# Patient Record
Sex: Female | Born: 1950 | Race: White | Hispanic: No | Marital: Married | Smoking: Never smoker
Health system: Southern US, Community
[De-identification: ages and names within clinical notes are randomized; demographics above are authoritative.]

## PROBLEM LIST (undated history)

## (undated) DIAGNOSIS — C801 Malignant (primary) neoplasm, unspecified: Secondary | ICD-10-CM

## (undated) DIAGNOSIS — G2581 Restless legs syndrome: Secondary | ICD-10-CM

## (undated) HISTORY — PX: COLONOSCOPY: SHX174

## (undated) HISTORY — DX: Restless legs syndrome: G25.81

## (undated) HISTORY — PX: CHOLECYSTECTOMY: SHX55

## (undated) HISTORY — DX: Malignant (primary) neoplasm, unspecified: C80.1

---
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
## 1977-03-18 HISTORY — PX: ABDOMINAL HYSTERECTOMY: SHX81

## 2004-04-30 ENCOUNTER — Ambulatory Visit: Payer: Self-pay | Admitting: Unknown Physician Specialty

## 2005-07-04 ENCOUNTER — Ambulatory Visit: Payer: Self-pay | Admitting: Unknown Physician Specialty

## 2005-08-13 ENCOUNTER — Ambulatory Visit: Payer: Self-pay | Admitting: Gastroenterology

## 2006-08-21 ENCOUNTER — Ambulatory Visit: Payer: Self-pay | Admitting: Unknown Physician Specialty

## 2006-08-28 ENCOUNTER — Ambulatory Visit: Payer: Self-pay | Admitting: Unknown Physician Specialty

## 2006-10-02 ENCOUNTER — Ambulatory Visit: Payer: Self-pay | Admitting: Specialist

## 2007-09-02 ENCOUNTER — Ambulatory Visit: Payer: Self-pay | Admitting: Unknown Physician Specialty

## 2009-02-13 ENCOUNTER — Ambulatory Visit: Payer: Self-pay | Admitting: Unknown Physician Specialty

## 2009-12-06 ENCOUNTER — Ambulatory Visit: Payer: Self-pay | Admitting: Otolaryngology

## 2010-03-05 ENCOUNTER — Ambulatory Visit: Payer: Self-pay | Admitting: Unknown Physician Specialty

## 2011-03-18 ENCOUNTER — Ambulatory Visit: Payer: Self-pay | Admitting: Unknown Physician Specialty

## 2011-10-11 ENCOUNTER — Ambulatory Visit (INDEPENDENT_AMBULATORY_CARE_PROVIDER_SITE_OTHER): Payer: BC Managed Care – PPO | Admitting: Internal Medicine

## 2011-10-11 ENCOUNTER — Encounter: Payer: Self-pay | Admitting: Internal Medicine

## 2011-10-11 VITALS — BP 110/68 | HR 77 | Temp 98.4°F | Resp 14 | Ht 65.5 in | Wt 158.5 lb

## 2011-10-11 DIAGNOSIS — Z13220 Encounter for screening for lipoid disorders: Secondary | ICD-10-CM

## 2011-10-11 DIAGNOSIS — Z124 Encounter for screening for malignant neoplasm of cervix: Secondary | ICD-10-CM

## 2011-10-11 DIAGNOSIS — G2581 Restless legs syndrome: Secondary | ICD-10-CM

## 2011-10-11 DIAGNOSIS — Z23 Encounter for immunization: Secondary | ICD-10-CM

## 2011-10-11 DIAGNOSIS — C801 Malignant (primary) neoplasm, unspecified: Secondary | ICD-10-CM

## 2011-10-11 DIAGNOSIS — C44310 Basal cell carcinoma of skin of unspecified parts of face: Secondary | ICD-10-CM | POA: Insufficient documentation

## 2011-10-11 NOTE — Progress Notes (Signed)
Patient ID: Christina Hammond, female   DOB: [birth year], 61 y.o.   MRN: [id number]   Patient Active Problem List  Diagnosis  . Cancer  . Restless legs syndrome (RLS)    Subjective:  CC:   Chief Complaint  Patient presents with  . New Patient    HPI:   Christina Hammond a 61 y.o. female who presents  In order to establish primary care .  She is a healthy 61 yr old white female with no significant PMH other than mild restless legs syndrome that she manages without medication. She is acute occasional seasonal rhinitis which she manages with over-the-counter antihistamines. She is transferring care from [facility].  Past Medical History  Diagnosis Date  . Cancer     basal cell,  [facility]   . Restless legs syndrome (RLS)     managed with minimal meds    Past Surgical History  Procedure Date  . Abdominal hysterectomy 1979  . Cholecystectomy   . Appendectomy 1956  . Cesarean section          The following portions of the patient's history were reviewed and updated as appropriate: Allergies, current medications, and problem list.    Review of Systems:   Review of Systems  Constitutional: Negative for fever, weight loss and malaise/fatigue.  HENT: Negative for nosebleeds and sore throat.   Eyes: Negative for blurred vision.  Respiratory: Negative for cough and shortness of breath.   Cardiovascular: Negative for chest pain, orthopnea and PND.  Gastrointestinal: Negative for heartburn, nausea, abdominal pain and blood in stool.  Genitourinary: Negative for frequency.  Musculoskeletal: Negative for back pain and falls.  Skin: Negative for rash.  Neurological: Negative for dizziness and headaches.  Endo/Heme/Allergies: Does not bruise/bleed easily.  Psychiatric/Behavioral: Negative for depression and memory loss. The patient is not nervous/anxious.        History   Social History  . Marital Status: Married    Spouse Name: N/A    Number of Children: N/A  . Years  of Education: N/A   Occupational History  . real estate agent    Social History Main Topics  . Smoking status: Never Smoker   . Smokeless tobacco: Never Used  . Alcohol Use: Yes  . Drug Use: No  . Sexually Active: Not on file   Other Topics Concern  . Not on file   Social History Narrative  . No narrative on file    Objective:  BP 110/68  Pulse 77  Temp 98.4 F (36.9 C) (Oral)  Resp 14  Ht 5' 5.5" (1.664 m)  Wt 158 lb 8 oz (71.895 kg)  BMI 25.97 kg/m2  SpO2 98%  General appearance: alert, cooperative and appears stated age Ears: normal TM's and external ear canals both ears Throat: lips, mucosa, and tongue normal; teeth and gums normal Neck: no adenopathy, no carotid bruit, supple, symmetrical, trachea midline and thyroid not enlarged, symmetric, no tenderness/mass/nodules Back: symmetric, no curvature. ROM normal. No CVA tenderness. Lungs: clear to auscultation bilaterally Heart: regular rate and rhythm, S1, S2 normal, no murmur, click, rub or gallop Abdomen: soft, non-tender; bowel sounds normal; no masses,  no organomegaly Pulses: 2+ and symmetric Skin: Skin color, texture, turgor normal. No rashes or lesions Lymph nodes: Cervical, supraclavicular, and axillary nodes normal.  Assessment and Plan:  Restless legs syndrome (RLS) Manage with relaxation and Epsom salts soaks. No medication is needed.  Cancer She had a basal cell carcinoma removed by a dermatologist at [facility].  Reviewed  proper use of sunscreen hours to avoid sun and use of vitamin D.  Screening for malignant neoplasm of the cervix Her last Pap smear was in 2012. She'll return for her annual exam with pelvic in 3 months.  Screening for lipoid disorders Per patient she has no history of hyperlipidemia we reviewed her diet today and she is following measuring diet on most days.   Updated Medication List Outpatient Encounter Prescriptions as of 10/11/2011  Medication Sig Dispense Refill  . calcium  carbonate (OS-CAL) 600 MG TABS Take 600 mg by mouth 2 (two) times daily with a meal.

## 2011-10-11 NOTE — Patient Instructions (Addendum)
Confirm the allergy to diclofenac..     Consider a Low Glycemic Index Diet and eating 6 smaller meals daily .  This frequent feeding stimulates your metabolism and the lower glycemic index foods will lower your blood sugars:   This is an example of my daily  "Low GI"  Diet:  All of the foods can be found at grocery stores and in bulk at [employer]  club   7 AM Breakfast:  Low carbohydrate Protein  Shakes (I recommend the EAS AdvantEdge "Carb Control" shakes  Or the low carb shakes by Atkins.   Both are available everywhere:  In  cases at [employer]  Or in 4 packs at grocery stores and pharmacies  2.5 carbs  (Alternative is  a toasted Arnold's Sandwhich Thin w/ peanut butter, a "Bagel Thin" with cream cheese and salmon) or  a scrambled egg burrito made with a low carb tortilla .  Avoid cereal and bananas, oatmeal too unless the old fashioned kind that takes 30-40 minutes to prepare.  the rest is overly processed, has minimal fiber, and loaded with carbohydrates!   10 AM: Protein bar by Atkins (the snack size, under 200 cal.  There are many varieties , available widely again or in bulk in limited varieties at [employer]Js)  Other so called "protein bars" tend to be loaded with carbohydrates.  Remember, in food advertising, the word "energy" is synonymous for " carbohydrate."  Lunch: sandwich of [place], (or any lunchmeat or canned tuna), fresh avocado and cheese on a lower carbohydrate pita bread, flatbread, or tortilla . Ok to use mayonnaise. The bread is the only source or carbohydrate that can be decreased (Joseph's makes a pita bread and a flat bread  Are 50 cal and 4 net carbs ; Toufayan makes a low carb flatbread 100 cal and 9 net carbs  and  Mission makes a low carb whole wheat tortilla  210 cal and 6 net carbs)  3 PM:  Mid day :  Another proteintttt bThe brear,  Or a  cheese stick (100 cal, 0 carbs),  Or 1 ounce of  almonds, walnuts, pistachios, pecans, peanuts,  Macadamia nuts. Or a Dannon light n Fit greek yogurt,  80 cal 8 net carbs . Avoid "granola"; the dried cranberries and raisins are loaded with carbohydrates.    6 PM  Dinner:  "mean and green:"  Meat/chicken/fish or a high protein legume; , with a green salad, and a low GI  Veggie (broccoli, cauliflower, green beans, spinach, brussel sprouts. Lima beans) : Avoid "Low fat dressings, Reyne Dumas and [address]! They are loaded with sugar! Instead use ranch, vinagrette,  Blue cheese, etc  9 PM snack : Breyer's "low carb" fudgsicle or  ice cream bar (Carb Smart line), or  Weight Watcher's ice cream bar , or anouther "no sugar added" ice cream; or another protein shake or a serving of fresh fruit with whipped cream (Avoid bananas, pineapple, grapes  and watermelon on a regular basis because they are high in sugar)   Remember that snack Substitutions should be less than 15 to 20 carbs  Per serving. Remember to subtract fiber grams to get the "net carbs."

## 2011-10-13 ENCOUNTER — Encounter: Payer: Self-pay | Admitting: Internal Medicine

## 2011-10-13 DIAGNOSIS — E785 Hyperlipidemia, unspecified: Secondary | ICD-10-CM | POA: Insufficient documentation

## 2011-10-13 DIAGNOSIS — Z13220 Encounter for screening for lipoid disorders: Secondary | ICD-10-CM | POA: Insufficient documentation

## 2011-10-13 DIAGNOSIS — Z90710 Acquired absence of both cervix and uterus: Secondary | ICD-10-CM | POA: Insufficient documentation

## 2011-10-13 NOTE — Assessment & Plan Note (Signed)
Manage with relaxation and Epsom salts soaks. No medication is needed.

## 2011-10-13 NOTE — Assessment & Plan Note (Signed)
Per patient she has no history of hyperlipidemia we reviewed her diet today and she is following measuring diet on most days.

## 2011-10-13 NOTE — Assessment & Plan Note (Signed)
She had a basal cell carcinoma removed by a dermatologist at [facility].  Reviewed proper use of sunscreen hours to avoid sun and use of vitamin D.

## 2011-10-13 NOTE — Assessment & Plan Note (Signed)
Her last Pap smear was in 2012. She'll return for her annual exam with pelvic in 3 months.

## 2011-11-13 ENCOUNTER — Ambulatory Visit: Payer: BC Managed Care – PPO | Admitting: Internal Medicine

## 2012-01-09 ENCOUNTER — Telehealth: Payer: Self-pay | Admitting: Internal Medicine

## 2012-01-09 NOTE — Telephone Encounter (Signed)
Pt was calling and wanting to know if she could come in before the end of the year to have her CPE because of her Insurance. She is due for one and they will cover it completely before the end of the year. I told her Dr. Darrick Huntsman was out until March 2014 and I would have to ask.

## 2012-01-09 NOTE — Telephone Encounter (Signed)
Only if there is a cancellation of a new patient or a physical or if you have something in late December  . I am not allowing urgent slots to be taken up by physical just to save somebody a copay.

## 2012-01-09 NOTE — Telephone Encounter (Signed)
See attached note below by Asher Muir, patient stated that she had a CPE done 01/10/11. And its very important that she get one before the end of the year due to insurance purpose.

## 2012-01-10 ENCOUNTER — Ambulatory Visit (INDEPENDENT_AMBULATORY_CARE_PROVIDER_SITE_OTHER): Payer: BC Managed Care – PPO | Admitting: Internal Medicine

## 2012-01-10 DIAGNOSIS — Z23 Encounter for immunization: Secondary | ICD-10-CM

## 2012-01-10 NOTE — Telephone Encounter (Signed)
Left message on patient vm letting her know that if there is a cancellation she can be seen sooner.

## 2012-02-24 ENCOUNTER — Ambulatory Visit (INDEPENDENT_AMBULATORY_CARE_PROVIDER_SITE_OTHER): Payer: BC Managed Care – PPO | Admitting: Internal Medicine

## 2012-02-24 ENCOUNTER — Encounter: Payer: Self-pay | Admitting: Internal Medicine

## 2012-02-24 ENCOUNTER — Other Ambulatory Visit: Payer: Self-pay

## 2012-02-24 VITALS — BP 122/66 | HR 72 | Temp 97.6°F | Resp 12 | Ht 65.0 in | Wt 161.5 lb

## 2012-02-24 DIAGNOSIS — Z1211 Encounter for screening for malignant neoplasm of colon: Secondary | ICD-10-CM

## 2012-02-24 DIAGNOSIS — Z13220 Encounter for screening for lipoid disorders: Secondary | ICD-10-CM

## 2012-02-24 DIAGNOSIS — Z124 Encounter for screening for malignant neoplasm of cervix: Secondary | ICD-10-CM

## 2012-02-24 DIAGNOSIS — N952 Postmenopausal atrophic vaginitis: Secondary | ICD-10-CM

## 2012-02-24 DIAGNOSIS — G2581 Restless legs syndrome: Secondary | ICD-10-CM

## 2012-02-24 DIAGNOSIS — R5383 Other fatigue: Secondary | ICD-10-CM

## 2012-02-24 DIAGNOSIS — Z0000 Encounter for general adult medical examination without abnormal findings: Secondary | ICD-10-CM

## 2012-02-24 DIAGNOSIS — R5381 Other malaise: Secondary | ICD-10-CM

## 2012-02-24 DIAGNOSIS — Z1331 Encounter for screening for depression: Secondary | ICD-10-CM

## 2012-02-24 DIAGNOSIS — Z1239 Encounter for other screening for malignant neoplasm of breast: Secondary | ICD-10-CM

## 2012-02-24 MED ORDER — CITALOPRAM HYDROBROMIDE 20 MG PO TABS: 20.0000 mg | ORAL_TABLET | Freq: Every day | ORAL | Status: DC

## 2012-02-24 MED ORDER — ALPRAZOLAM 0.25 MG PO TABS: 0.2500 mg | ORAL_TABLET | Freq: Every evening | ORAL | Status: DC | PRN

## 2012-02-24 NOTE — Progress Notes (Signed)
Patient ID: [name], female   DOB: 1950/05/16, 61 y.o.   MRN: [id number]  Subjective:     Christina Hammond is a 61 y.o. female and is here for a comprehensive physical exam. . She is S/p hysterectomy at age 23 but dr Christina Hammond did a PAP Oct 2012.  Multiple issues  1) No desire for sex .  Doesn't even get aroused during steamy movies, reports vaginal dryness causing painful sex.  Has apprehension regarding intercourse because of dyspareunia.  She had been prescribed  premarin cream last year but never tried it.   2) chronic insomnia.  She has been rationing lunesta and using 1/2 tablets prn.   Insomnia is aggravated by restless legs.  Discomfort is relieved somewhat by hot showers.    History   Social History  . Marital Status: Married    Spouse Name: N/A    Number of Children: N/A  . Years of Education: N/A   Occupational History  . real estate agent    Social History Main Topics  . Smoking status: Never Smoker   . Smokeless tobacco: Never Used  . Alcohol Use: Yes  . Drug Use: No  . Sexually Active: Not on file   Other Topics Concern  . Not on file   Social History Narrative  . No narrative on file   Health Maintenance  Topic Date Due  . Zostavax  04/23/2008  . Influenza Vaccine  11/16/2012  . Mammogram  03/17/2013  . Pap Smear  02/23/2014  . Colonoscopy  10/10/2016  . Tetanus/tdap  10/10/2021    The following portions of the patient's history were reviewed and updated as appropriate: allergies, current medications, past family history, past medical history, past social history, past surgical history and problem list.  Review of Systems A comprehensive review of systems was negative.   Objective:     Healthy female exam. BP 122/66  Pulse 72  Temp 97.6 F (36.4 C) (Oral)  Resp 12  Ht 5\' 5"  (1.651 m)  Wt 161 lb 8 oz (73.256 kg)  BMI 26.88 kg/m2  SpO2 97%  General Appearance:    Alert, cooperative, no distress, appears stated age  Head:    Normocephalic, without  obvious abnormality, atraumatic  Eyes:    PERRL, conjunctiva/corneas clear, EOM's intact, fundi    benign, both eyes  Ears:    Normal TM's and external ear canals, both ears  Nose:   Nares normal, septum midline, mucosa normal, no drainage    or sinus tenderness  Throat:   Lips, mucosa, and tongue normal; teeth and gums normal  Neck:   Supple, symmetrical, trachea midline, no adenopathy;    thyroid:  no enlargement/tenderness/nodules; no carotid   bruit or JVD  Back:     Symmetric, no curvature, ROM normal, no CVA tenderness  Lungs:     Clear to auscultation bilaterally, respirations unlabored  Chest Wall:    No tenderness or deformity   Heart:    Regular rate and rhythm, S1 and S2 normal, no murmur, rub   or gallop  Breast Exam:    No tenderness, masses, or nipple abnormality  Abdomen:     Soft, non-tender, bowel sounds active all four quadrants,    no masses, no organomegaly  Genitalia:    Atrophic vagina, cervix absent. adnexae nonpalpable  Extremities:   Extremities normal, atraumatic, no cyanosis or edema  Pulses:   2+ and symmetric all extremities  Skin:   Skin color, texture, turgor normal, no  rashes or lesions  Lymph nodes:   Cervical, supraclavicular, and axillary nodes normal  Neurologic:   CNII-XII intact, normal strength, sensation and reflexes    throughout        Assessment:     Screening for malignant neoplasm of the cervix Exam today reveals a surgically removed cervix.  No PAP smear done.   Restless legs syndrome (RLS) Checking for iron deficiency.  Continue lunesta for now.   Special screening for malignant neoplasms, colon Patient was sent home with take home FOBT.   Postmenopausal atrophic vaginitis Symptomatic with dyspareunia.  Patient advised and instructed on use of premarin vaginal ointment.  Alternative of vaginal tablets discussed if the ointment is not tolerated.    Updated Medication List Outpatient Encounter Prescriptions as of 02/24/2012   Medication Sig Dispense Refill  . calcium carbonate (OS-CAL) 600 MG TABS Take 600 mg by mouth 2 (two) times daily with a meal.      . cetirizine (ZYRTEC) 10 MG tablet Take 10 mg by mouth daily.      . eszopiclone (LUNESTA) 2 MG TABS Take 2 mg by mouth at bedtime. Take immediately before bedtime      . ALPRAZolam (XANAX) 0.25 MG tablet Take 1 tablet (0.25 mg total) by mouth at bedtime as needed for anxiety.  30 tablet  1  . citalopram (CELEXA) 20 MG tablet Take 1 tablet (20 mg total) by mouth daily.  30 tablet  3

## 2012-02-24 NOTE — Patient Instructions (Addendum)
Consider a trial of generic celexa (citalopram) for anxieyt,  It may help your insomnia.  Start with 1/2 tablet daily in the evening, for one week, then increase to 1 whole tablet   I am also prescribing alprazolam( Xanax) for you to try up to one hour before intercourse to help you relax  For the vaginal dryness,  Start using the cream every night for two weeks,  Then reduce use to twice a week  The altenative is a vaginal tablet (less messy,  More expensive possibly)  Return for fasting labs  Please use the take home stool kit to submit the test for occult blood, you can mail it back or return it on your lab date

## 2012-02-25 ENCOUNTER — Encounter: Payer: Self-pay | Admitting: Internal Medicine

## 2012-02-25 DIAGNOSIS — Z1211 Encounter for screening for malignant neoplasm of colon: Secondary | ICD-10-CM | POA: Insufficient documentation

## 2012-02-25 DIAGNOSIS — N952 Postmenopausal atrophic vaginitis: Secondary | ICD-10-CM | POA: Insufficient documentation

## 2012-02-25 NOTE — Assessment & Plan Note (Signed)
Symptomatic with dyspareunia.  Patient advised and instructed on use of premarin vaginal ointment.  Alternative of vaginal tablets discussed if the ointment is not tolerated.

## 2012-02-25 NOTE — Assessment & Plan Note (Signed)
Patient was sent home with take home FOBT.

## 2012-02-25 NOTE — Assessment & Plan Note (Signed)
Checking for iron deficiency.  Continue lunesta for now.

## 2012-02-25 NOTE — Assessment & Plan Note (Signed)
Exam today reveals a surgically removed cervix.  No PAP smear done.

## 2012-02-26 ENCOUNTER — Telehealth: Payer: Self-pay | Admitting: Internal Medicine

## 2012-02-26 DIAGNOSIS — F419 Anxiety disorder, unspecified: Secondary | ICD-10-CM

## 2012-02-26 NOTE — Telephone Encounter (Signed)
Please change her prescriptions that were called into [facility] to 90 days.

## 2012-02-27 ENCOUNTER — Other Ambulatory Visit: Payer: Self-pay

## 2012-02-27 NOTE — Telephone Encounter (Signed)
Patient request a refill for Lunesta with a 90 day supply sent to [facility], she also wants to know if she can get 90 day supply with Xanax and Citalopram

## 2012-02-28 ENCOUNTER — Other Ambulatory Visit: Payer: Self-pay

## 2012-02-28 MED ORDER — ESZOPICLONE 2 MG PO TABS: 2.0000 mg | ORAL_TABLET | Freq: Every day | ORAL | Status: DC

## 2012-02-28 MED ORDER — ALPRAZOLAM 0.25 MG PO TABS: 0.2500 mg | ORAL_TABLET | Freq: Every evening | ORAL | Status: DC | PRN

## 2012-02-28 NOTE — Addendum Note (Signed)
Addended by: Sherlene Shams on: 02/28/2012 11:45 AM   Modules accepted: Orders

## 2012-02-28 NOTE — Telephone Encounter (Signed)
Not with xanax,  30 day supply only. With 2 refills.   Ok to do 90 day on the citalopram with 3 reflls

## 2012-02-28 NOTE — Telephone Encounter (Signed)
Rx Xanax 0.25 mg and Lunesta 2 mg faxed to [facility]

## 2012-02-29 MED ORDER — CITALOPRAM HYDROBROMIDE 20 MG PO TABS: 20.0000 mg | ORAL_TABLET | Freq: Every day | ORAL | Status: DC

## 2012-02-29 NOTE — Telephone Encounter (Signed)
Rx for citalopram changed to 90 days;  I do not do 90 day refills on controlled substances so the alprazolam willl remain 30 days.

## 2012-03-02 NOTE — Telephone Encounter (Signed)
Rx faxed to [facility].

## 2012-03-04 ENCOUNTER — Telehealth: Payer: Self-pay | Admitting: *Deleted

## 2012-03-04 ENCOUNTER — Other Ambulatory Visit (INDEPENDENT_AMBULATORY_CARE_PROVIDER_SITE_OTHER): Payer: BC Managed Care – PPO

## 2012-03-04 DIAGNOSIS — Z1211 Encounter for screening for malignant neoplasm of colon: Secondary | ICD-10-CM

## 2012-03-04 DIAGNOSIS — Z13220 Encounter for screening for lipoid disorders: Secondary | ICD-10-CM

## 2012-03-04 DIAGNOSIS — G2581 Restless legs syndrome: Secondary | ICD-10-CM

## 2012-03-04 DIAGNOSIS — R5383 Other fatigue: Secondary | ICD-10-CM

## 2012-03-04 DIAGNOSIS — R5381 Other malaise: Secondary | ICD-10-CM

## 2012-03-04 LAB — CBC WITH DIFFERENTIAL/PLATELET
Basophils Absolute: 0 10*3/uL (ref 0.0–0.1)
Basophils Relative: 0.3 % (ref 0.0–3.0)
Eosinophils Absolute: 0.1 10*3/uL (ref 0.0–0.7)
Hemoglobin: 13.4 g/dL (ref 12.0–15.0)
Lymphs Abs: 1.6 10*3/uL (ref 0.7–4.0)
MCHC: 33.1 g/dL (ref 30.0–36.0)
MCV: 88.8 fl (ref 78.0–100.0)
Monocytes Absolute: 0.3 10*3/uL (ref 0.1–1.0)
Neutro Abs: 2.3 10*3/uL (ref 1.4–7.7)
RBC: 4.57 Mil/uL (ref 3.87–5.11)
RDW: 13.2 % (ref 11.5–14.6)

## 2012-03-04 LAB — LIPID PANEL
HDL: 64.6 mg/dL (ref >39.00)
LDL Cholesterol: 114 mg/dL — ABNORMAL HIGH (ref 0–99)
Total CHOL/HDL Ratio: 3
Triglycerides: 75 mg/dL (ref 0.0–149.0)

## 2012-03-04 LAB — COMPREHENSIVE METABOLIC PANEL
ALT: 39 U/L — ABNORMAL HIGH (ref 0–35)
AST: 34 U/L (ref 0–37)
CO2: 29 mEq/L (ref 19–32)
Chloride: 103 mEq/L (ref 96–112)
Creatinine, Ser: 0.7 mg/dL (ref 0.4–1.2)
Sodium: 139 mEq/L (ref 135–145)
Total Bilirubin: 0.6 mg/dL (ref 0.3–1.2)
Total Protein: 7.1 g/dL (ref 6.0–8.3)

## 2012-03-04 NOTE — Telephone Encounter (Signed)
What dx would you like for this pt IFOB? Thank you

## 2012-03-04 NOTE — Telephone Encounter (Signed)
V76.51  colon ca screening

## 2012-03-05 ENCOUNTER — Encounter: Payer: Self-pay | Admitting: Internal Medicine

## 2012-03-05 DIAGNOSIS — Z23 Encounter for immunization: Secondary | ICD-10-CM

## 2012-03-05 NOTE — Addendum Note (Signed)
Addended by: Sherlene Shams on: 03/05/2012 05:35 PM   Modules accepted: Orders

## 2012-03-06 ENCOUNTER — Encounter: Payer: Self-pay | Admitting: Internal Medicine

## 2012-03-06 MED ORDER — FLUTICASONE PROPIONATE 50 MCG/ACT NA SUSP: 2.0000 | Freq: Every day | NASAL | Status: DC

## 2012-03-25 ENCOUNTER — Ambulatory Visit: Payer: Self-pay | Admitting: Internal Medicine

## 2012-04-08 ENCOUNTER — Encounter: Payer: Self-pay | Admitting: Internal Medicine

## 2012-04-20 ENCOUNTER — Encounter: Payer: Self-pay | Admitting: Internal Medicine

## 2012-05-29 ENCOUNTER — Encounter: Payer: Self-pay | Admitting: Internal Medicine

## 2012-06-03 ENCOUNTER — Encounter: Payer: Self-pay | Admitting: Internal Medicine

## 2012-06-05 MED ORDER — ZOLPIDEM TARTRATE 10 MG PO TABS: 10.0000 mg | ORAL_TABLET | Freq: Every evening | ORAL | Status: DC | PRN

## 2012-07-17 ENCOUNTER — Encounter: Payer: Self-pay | Admitting: Internal Medicine

## 2012-07-28 ENCOUNTER — Ambulatory Visit (INDEPENDENT_AMBULATORY_CARE_PROVIDER_SITE_OTHER): Payer: BC Managed Care – PPO | Admitting: Adult Health

## 2012-07-28 ENCOUNTER — Encounter: Payer: Self-pay | Admitting: Adult Health

## 2012-07-28 VITALS — BP 100/58 | HR 86 | Resp 12 | Wt 165.0 lb

## 2012-07-28 DIAGNOSIS — M79674 Pain in right toe(s): Secondary | ICD-10-CM

## 2012-07-28 DIAGNOSIS — J309 Allergic rhinitis, unspecified: Secondary | ICD-10-CM

## 2012-07-28 DIAGNOSIS — M79609 Pain in unspecified limb: Secondary | ICD-10-CM

## 2012-07-28 DIAGNOSIS — J302 Other seasonal allergic rhinitis: Secondary | ICD-10-CM

## 2012-07-28 DIAGNOSIS — L309 Dermatitis, unspecified: Secondary | ICD-10-CM

## 2012-07-28 DIAGNOSIS — L259 Unspecified contact dermatitis, unspecified cause: Secondary | ICD-10-CM

## 2012-07-28 MED ORDER — TRIAMCINOLONE ACETONIDE 0.1 % EX CREA: TOPICAL_CREAM | Freq: Two times a day (BID) | CUTANEOUS | Status: DC

## 2012-07-28 MED ORDER — FEXOFENADINE HCL 60 MG PO TABS: 60.0000 mg | ORAL_TABLET | Freq: Every day | ORAL | Status: DC

## 2012-07-28 MED ORDER — MOMETASONE FUROATE 50 MCG/ACT NA SUSP: 2.0000 | Freq: Every day | NASAL | Status: DC

## 2012-07-28 NOTE — Assessment & Plan Note (Signed)
Appears to be contact dermatitis from poison oak or ivy. Small area of upper forearm. Triamcinolone ointment bid.

## 2012-07-28 NOTE — Assessment & Plan Note (Signed)
Tingling and numbness right great toe. First noticed after sitting at a bistro with legs dangling. More than likely secondary to circulation compromise from dangling legs. Avoid dangling legs and move positions every 2 hours if possible. If symptoms still not improved then consider doppler study for arterial compromise.

## 2012-07-28 NOTE — Assessment & Plan Note (Signed)
Nasonex 2 sprays in each nostril daily. Sample given. Allegra 60 mg daily.

## 2012-07-28 NOTE — Progress Notes (Signed)
  Subjective:    Patient ID: Christina Hammond, female    DOB: Apr 30, 1950, 62 y.o.   MRN: [id number]  HPI  Patient is a pleasant 62 year old female who presents to clinic with the following concerns:  1) one month discomfort in left great toe and adjacent toes. 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 She stopped sitting with her legs dangling and noticed that the symptoms improved. She had also recently just came back from a trip to [place] and noticed similar symptoms after sitting in a car for a while. She does not have any swelling. There is some coolness in her toes. There is no discoloration.  2) patient has an area on the left forearm where she thinks she may have been bitten by a mosquito or may have come in contact with poison oak. The area was itchy initially. She denies that it is itchy at present.  3) Seasonal allergies - Zyrtec does not help   Current Outpatient Prescriptions on File Prior to Visit  Medication Sig Dispense Refill  . calcium carbonate (OS-CAL) 600 MG TABS Take 600 mg by mouth 2 (two) times daily with a meal.      . cetirizine (ZYRTEC) 10 MG tablet Take 10 mg by mouth daily.       No current facility-administered medications on file prior to visit.      Review of Systems  Cardiovascular: Negative for leg swelling.       Coolness and discomfort of right great toe and adjacent toes.  Skin: Positive for rash.  Allergic/Immunologic: Positive for environmental allergies.     BP 100/58  Pulse 86  Resp 12  Wt 165 lb (74.844 kg)  BMI 27.46 kg/m2  SpO2 97%    Objective:   Physical Exam  Constitutional: She is oriented to person, place, and time. She appears well-developed and well-nourished. No distress.  Cardiovascular: Intact distal pulses.   Musculoskeletal: She exhibits no edema.  Neurological: She is alert and oriented to person, place, and time. Coordination normal.  Intact sensation to sharp and soft object bilateral  feet and legs.  Skin: Rash noted. There is erythema.  Psychiatric: She has a normal mood and affect. Her behavior is normal. Judgment and thought content normal.          Assessment & Plan:

## 2012-07-28 NOTE — Patient Instructions (Addendum)
  Triamcinolone ointment twice daily to the rash.  Nasonex 2 sprays in each nostril daily for allergies.  Allegra 60 mg daily for allergies.  While dangling your feet you could have prevented circulation to your feet which was causing your symptoms.  If the symptoms do not resolve within 2-3 weeks, we can send you for an arterial study to check for circulatory problems.

## 2012-12-10 ENCOUNTER — Ambulatory Visit (INDEPENDENT_AMBULATORY_CARE_PROVIDER_SITE_OTHER): Payer: BC Managed Care – PPO | Admitting: *Deleted

## 2012-12-10 DIAGNOSIS — Z23 Encounter for immunization: Secondary | ICD-10-CM

## 2012-12-16 MED ORDER — ZOLPIDEM TARTRATE 10 MG PO TABS: 10.0000 mg | ORAL_TABLET | Freq: Every evening | ORAL | Status: DC | PRN

## 2012-12-16 NOTE — Addendum Note (Signed)
Addended by: Sherlene Shams on: 12/16/2012 01:49 PM   Modules accepted: Orders

## 2013-06-28 ENCOUNTER — Other Ambulatory Visit: Payer: Self-pay | Admitting: *Deleted

## 2013-06-28 MED ORDER — ZOLPIDEM TARTRATE 10 MG PO TABS
10.0000 mg | ORAL_TABLET | Freq: Every evening | ORAL | Status: DC | PRN
Start: 2013-06-28 — End: 2013-07-12

## 2013-06-28 NOTE — Telephone Encounter (Signed)
No appointment since 5/14 Please advise?

## 2013-06-28 NOTE — Telephone Encounter (Signed)
Refill one 30 days only.  Has not been seen in over 6 months so needs office visit prior to any more refills 

## 2013-06-28 NOTE — Telephone Encounter (Signed)
Patient notified

## 2013-07-06 ENCOUNTER — Encounter: Payer: BC Managed Care – PPO | Admitting: Internal Medicine

## 2013-07-07 ENCOUNTER — Ambulatory Visit (INDEPENDENT_AMBULATORY_CARE_PROVIDER_SITE_OTHER): Payer: Medicare Other | Admitting: Internal Medicine

## 2013-07-07 ENCOUNTER — Encounter: Payer: Self-pay | Admitting: Internal Medicine

## 2013-07-07 VITALS — BP 118/70 | HR 90 | Temp 98.5°F | Resp 16 | Ht 66.0 in | Wt 167.2 lb

## 2013-07-07 DIAGNOSIS — Z23 Encounter for immunization: Secondary | ICD-10-CM

## 2013-07-07 DIAGNOSIS — L649 Androgenic alopecia, unspecified: Secondary | ICD-10-CM

## 2013-07-07 DIAGNOSIS — R5383 Other fatigue: Secondary | ICD-10-CM

## 2013-07-07 DIAGNOSIS — E559 Vitamin D deficiency, unspecified: Secondary | ICD-10-CM

## 2013-07-07 DIAGNOSIS — M79609 Pain in unspecified limb: Secondary | ICD-10-CM

## 2013-07-07 DIAGNOSIS — M1610 Unilateral primary osteoarthritis, unspecified hip: Secondary | ICD-10-CM

## 2013-07-07 DIAGNOSIS — M79674 Pain in right toe(s): Secondary | ICD-10-CM

## 2013-07-07 DIAGNOSIS — C44319 Basal cell carcinoma of skin of other parts of face: Secondary | ICD-10-CM

## 2013-07-07 DIAGNOSIS — M169 Osteoarthritis of hip, unspecified: Secondary | ICD-10-CM

## 2013-07-07 DIAGNOSIS — L658 Other specified nonscarring hair loss: Secondary | ICD-10-CM

## 2013-07-07 DIAGNOSIS — Z124 Encounter for screening for malignant neoplasm of cervix: Secondary | ICD-10-CM

## 2013-07-07 DIAGNOSIS — E785 Hyperlipidemia, unspecified: Secondary | ICD-10-CM

## 2013-07-07 DIAGNOSIS — Z1239 Encounter for other screening for malignant neoplasm of breast: Secondary | ICD-10-CM

## 2013-07-07 DIAGNOSIS — R5381 Other malaise: Secondary | ICD-10-CM

## 2013-07-07 DIAGNOSIS — Z1211 Encounter for screening for malignant neoplasm of colon: Secondary | ICD-10-CM

## 2013-07-07 DIAGNOSIS — Z0000 Encounter for general adult medical examination without abnormal findings: Secondary | ICD-10-CM

## 2013-07-07 DIAGNOSIS — C44310 Basal cell carcinoma of skin of unspecified parts of face: Secondary | ICD-10-CM

## 2013-07-07 NOTE — Patient Instructions (Signed)
You had your annual Medicare wellness exam today  We will schedule your 3 d  mammogram at [facility] soon.  Please use the stool kit to send [place] back a sample to test for blood.  This is your colon CA screening test.   You need to have a Shingles vaccine.  I will check your chicken pox anitbody to be sure you can safely get the vaccine    You received the pneumonia vaccine today.  We will contact you with the bloodwork results

## 2013-07-07 NOTE — Progress Notes (Signed)
Pre-visit discussion using our clinic review tool. No additional management support is needed unless otherwise documented below in the visit note.  

## 2013-07-07 NOTE — Progress Notes (Signed)
Patient ID: Christina Hammond, female   DOB: 28-Dec-1950, 63 y.o.   MRN: [id number]  The patient is here for her welcome to Medicare wellness examination and management of other chronic and acute problems.Multiple issues to discuss:  1) Hair loss:  Has been having thinning and hair loss around the crown , has been a gradual loss occurring for the last 5 years.  Saw her dermatologist prescribed propecia  1 mg daily but has not started it. Discussed risks of side today and other alternatives.   2) Loss of libido.  No interest in sex,  But can still achieve orgasm.   3) Pain in great toe on the right foot gets very cold/numb followed by a burning sensation. Marland Kitchen Has been doing this for over 6 months.  No history of trauma   4) Right hip aches  if she lies on it but does not hurt otherwise.     5) Left hand goes numb if she falls asleep with arm raised above head    The risk factors are reflected in the social history.  The roster of all physicians providing medical care to patient - is listed in the Snapshot section of the chart.  Activities of daily living:  The patient is 100% independent in all ADLs: dressing, toileting, feeding as well as independent mobility  Home safety : The patient has smoke detectors in the home. They wear seatbelts.  There are no firearms at home. There is no violence in the home.   There is no risks for hepatitis, STDs or HIV. There is no   history of blood transfusion. They have no travel history to infectious disease endemic areas of the world.  The patient has seen their dentist in the last six month. They have seen their eye doctor in the last year. They admit to slight hearing difficulty with regard to whispered voices and some television programs.  They have deferred audiologic testing in the last year.  They do not  have excessive sun exposure. Discussed the need for sun protection: hats, long sleeves and use of sunscreen if there is significant sun exposure.   Diet:  the importance of a healthy diet is discussed. They do have a healthy diet.  The benefits of regular aerobic exercise were discussed. She walks 4 times per week ,  20 minutes.   Depression screen: there are no signs or vegative symptoms of depression- irritability, change in appetite, anhedonia, sadness/tearfullness.  Cognitive assessment: the patient manages all their financial and personal affairs and is actively engaged. They could relate day,date,year and events; recalled 2/3 objects at 3 minutes; performed clock-face test normally.  The following portions of the patient's history were reviewed and updated as appropriate: allergies, current medications, past family history, past medical history,  past surgical history, past social history  and problem list.  Visual acuity was not assessed per patient preference since she has regular follow up with her ophthalmologist. Hearing and body mass index were assessed and reviewed.   During the course of the visit the patient was educated and counseled about appropriate screening and preventive services including : fall prevention , diabetes screening, nutrition counseling, colorectal cancer screening, and recommended immunizations.    Objective:   BP 118/70  Pulse 90  Temp(Src) 98.5 F (36.9 C) (Oral)  Resp 16  Ht 5\' 6"  (1.676 m)  Wt 167 lb 4 oz (75.864 kg)  BMI 27.01 kg/m2  SpO2 98%  General appearance: alert, cooperative and appears stated age  Head: Normocephalic, without obvious abnormality, atraumatic. Scalp shows thinning of hair along the crown,  No skin lesions  Eyes: conjunctivae/corneas clear. PERRL, EOM's intact. Fundi benign. Ears: normal TM's and external ear canals both ears Nose: Nares normal. Septum midline. Mucosa normal. No drainage or sinus tenderness. Throat: lips, mucosa, and tongue normal; teeth and gums normal Neck: no adenopathy, no carotid bruit, no JVD, supple, symmetrical, trachea midline and thyroid not  enlarged, symmetric, no tenderness/mass/nodules Lungs: clear to auscultation bilaterally Breasts: normal appearance, no masses or tenderness Heart: regular rate and rhythm, S1, S2 normal, no murmur, click, rub or gallop Abdomen: soft, non-tender; bowel sounds normal; no masses,  no organomegaly Extremities: extremities normal, atraumatic, no cyanosis or edema.  ROM right hip normal.  Pulses: 2+ and symmetric in both feet.  Cap refill < 2 sec Skin: Skin color, texture, turgor normal. No rashes or lesions Neurologic: Alert and oriented X 3, normal strength and tone. Normal symmetric reflexes. Normal coordination and gait.   Assessment and Plan:  S/P hysterectomy Pelvic exam done 2013  Confirmed absence of cervix.  No PAP smear done.     Pain in toe of right foot Reassurance provided that she may have Renaud's phenomenom since her pulses are excellent.   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  Reassurance provided.   Encounter for initial preventive physical examination covered by Medicare Annual comprehensive exam was done including breast, excluding pelvic exam.  All screenings have been addressed .   A total of 45 minutes was spent with patient more than half of which was spent in counseling, reviewing records from other prviders and coordination of care.  Updated Medication List Outpatient Encounter Prescriptions as of 07/07/2013  Medication Sig  . calcium carbonate (OS-CAL) 600 MG TABS Take 600 mg by mouth 2 (two) times daily with a meal.  . fexofenadine (ALLEGRA) 60 MG tablet Take 1 tablet (60 mg total) by mouth daily.  . mometasone (NASONEX) 50 MCG/ACT nasal spray Place 2 sprays into the nose daily.  Marland Kitchen triamcinolone cream (KENALOG) 0.1 % Apply  topically 2 (two) times daily.  Marland Kitchen zolpidem (AMBIEN) 10 MG tablet Take 1 tablet (10 mg total) by mouth at bedtime as needed for sleep.  . [DISCONTINUED] zolpidem (AMBIEN) 10 MG tablet Take 1 tablet (10 mg total) by mouth at bedtime as needed for sleep.

## 2013-07-08 ENCOUNTER — Other Ambulatory Visit (INDEPENDENT_AMBULATORY_CARE_PROVIDER_SITE_OTHER): Payer: Medicare Other

## 2013-07-08 DIAGNOSIS — E785 Hyperlipidemia, unspecified: Secondary | ICD-10-CM

## 2013-07-08 DIAGNOSIS — Z23 Encounter for immunization: Secondary | ICD-10-CM

## 2013-07-08 DIAGNOSIS — R5381 Other malaise: Secondary | ICD-10-CM

## 2013-07-08 DIAGNOSIS — E559 Vitamin D deficiency, unspecified: Secondary | ICD-10-CM

## 2013-07-08 DIAGNOSIS — R5383 Other fatigue: Principal | ICD-10-CM

## 2013-07-08 LAB — CBC WITH DIFFERENTIAL/PLATELET
BASOS PCT: 0.3 % (ref 0.0–3.0)
Basophils Absolute: 0 10*3/uL (ref 0.0–0.1)
EOS ABS: 0.1 10*3/uL (ref 0.0–0.7)
Eosinophils Relative: 2.1 % (ref 0.0–5.0)
HCT: 40.1 % (ref 36.0–46.0)
Hemoglobin: 13.5 g/dL (ref 12.0–15.0)
LYMPHS PCT: 29.8 % (ref 12.0–46.0)
Lymphs Abs: 1.7 10*3/uL (ref 0.7–4.0)
MCHC: 33.6 g/dL (ref 30.0–36.0)
MCV: 89 fl (ref 78.0–100.0)
Monocytes Absolute: 0.3 10*3/uL (ref 0.1–1.0)
Monocytes Relative: 4.5 % (ref 3.0–12.0)
NEUTROS PCT: 63.3 % (ref 43.0–77.0)
Neutro Abs: 3.6 10*3/uL (ref 1.4–7.7)
PLATELETS: 278 10*3/uL (ref 150.0–400.0)
RBC: 4.5 Mil/uL (ref 3.87–5.11)
RDW: 13.2 % (ref 11.5–14.6)
WBC: 5.6 10*3/uL (ref 4.5–10.5)

## 2013-07-08 LAB — LIPID PANEL
CHOL/HDL RATIO: 3
CHOLESTEROL: 178 mg/dL (ref 0–200)
HDL: 56 mg/dL (ref >39.00)
LDL Cholesterol: 103 mg/dL — ABNORMAL HIGH (ref 0–99)
Triglycerides: 97 mg/dL (ref 0.0–149.0)
VLDL: 19.4 mg/dL (ref 0.0–40.0)

## 2013-07-08 LAB — COMPREHENSIVE METABOLIC PANEL
ALT: 31 U/L (ref 0–35)
AST: 20 U/L (ref 0–37)
Albumin: 3.7 g/dL (ref 3.5–5.2)
Alkaline Phosphatase: 50 U/L (ref 39–117)
BUN: 17 mg/dL (ref 6–23)
CALCIUM: 9.1 mg/dL (ref 8.4–10.5)
CHLORIDE: 103 meq/L (ref 96–112)
CO2: 31 mEq/L (ref 19–32)
Creatinine, Ser: 0.6 mg/dL (ref 0.4–1.2)
GFR: 100.73 mL/min (ref >60.00)
Glucose, Bld: 91 mg/dL (ref 70–99)
POTASSIUM: 4 meq/L (ref 3.5–5.1)
SODIUM: 140 meq/L (ref 135–145)
TOTAL PROTEIN: 6.8 g/dL (ref 6.0–8.3)
Total Bilirubin: 0.7 mg/dL (ref 0.3–1.2)

## 2013-07-08 LAB — TSH: TSH: 1.38 u[IU]/mL (ref 0.35–5.50)

## 2013-07-09 LAB — VARICELLA ZOSTER ABS, IGG/IGM: VARICELLA: 2280 {index} (ref >165)

## 2013-07-09 LAB — VITAMIN D 25 HYDROXY (VIT D DEFICIENCY, FRACTURES): Vit D, 25-Hydroxy: 50 ng/mL (ref 30–89)

## 2013-07-10 DIAGNOSIS — L649 Androgenic alopecia, unspecified: Secondary | ICD-10-CM | POA: Insufficient documentation

## 2013-07-10 DIAGNOSIS — M169 Osteoarthritis of hip, unspecified: Secondary | ICD-10-CM | POA: Insufficient documentation

## 2013-07-10 DIAGNOSIS — Z0000 Encounter for general adult medical examination without abnormal findings: Secondary | ICD-10-CM | POA: Insufficient documentation

## 2013-07-10 NOTE — Assessment & Plan Note (Signed)
Exam is normal.  Reassurance provided.

## 2013-07-10 NOTE — Assessment & Plan Note (Signed)
She has decided not to try Propecia due to the risk and side effect profile which includes orthostatic hypotension

## 2013-07-10 NOTE — Assessment & Plan Note (Signed)
Annual comprehensive exam was done including breast, excluding pelvicexam. All screenings have been addressed .  

## 2013-07-10 NOTE — Assessment & Plan Note (Signed)
Reassurance provided that she may have Renaud's phenomenom since her pulses are excellent.

## 2013-07-10 NOTE — Assessment & Plan Note (Signed)
Advised to continue annual follow up with dermatology

## 2013-07-10 NOTE — Assessment & Plan Note (Addendum)
Pelvic exam done 2013  Confirmed absence of cervix.  No PAP smear done.

## 2013-07-11 ENCOUNTER — Encounter: Payer: Self-pay | Admitting: Internal Medicine

## 2013-07-12 ENCOUNTER — Telehealth: Payer: Self-pay | Admitting: Internal Medicine

## 2013-07-12 ENCOUNTER — Other Ambulatory Visit (INDEPENDENT_AMBULATORY_CARE_PROVIDER_SITE_OTHER): Payer: Medicare Other

## 2013-07-12 DIAGNOSIS — Z1211 Encounter for screening for malignant neoplasm of colon: Secondary | ICD-10-CM

## 2013-07-12 LAB — FECAL OCCULT BLOOD, IMMUNOCHEMICAL: Fecal Occult Bld: NEGATIVE

## 2013-07-12 MED ORDER — ZOLPIDEM TARTRATE 10 MG PO TABS: 10.0000 mg | ORAL_TABLET | Freq: Every evening | ORAL | Status: DC | PRN

## 2013-07-12 NOTE — Telephone Encounter (Signed)
Ok to fill for 90 days last script had no refills.

## 2013-07-12 NOTE — Telephone Encounter (Signed)
Script faxed.

## 2013-07-12 NOTE — Telephone Encounter (Signed)
Ok to refill,  printed rx  

## 2013-07-12 NOTE — Telephone Encounter (Signed)
The patient is needing a 90 day prescription for her Ambien sent to her pharmacy . She did not pick up the last prescription ,because it cost less if she gets the 90 day instead of the 30 day.  zolpidem (AMBIEN) 10 MG tablet

## 2013-07-13 ENCOUNTER — Encounter: Payer: Self-pay | Admitting: Internal Medicine

## 2013-07-13 NOTE — Telephone Encounter (Signed)
Mailed unread message to pt  

## 2013-07-22 ENCOUNTER — Ambulatory Visit: Payer: Medicare Other

## 2013-07-26 ENCOUNTER — Ambulatory Visit
Admission: RE | Admit: 2013-07-26 | Discharge: 2013-07-26 | Disposition: A | Discharge status: Home / Self Care | Payer: No Typology Code available for payment source | Source: Ambulatory Visit | Attending: Internal Medicine | Admitting: Internal Medicine

## 2013-07-26 DIAGNOSIS — Z1239 Encounter for other screening for malignant neoplasm of breast: Secondary | ICD-10-CM

## 2013-07-29 ENCOUNTER — Encounter: Payer: Self-pay | Admitting: Internal Medicine

## 2014-01-13 ENCOUNTER — Telehealth: Payer: Self-pay | Admitting: Internal Medicine

## 2014-01-13 NOTE — Telephone Encounter (Signed)
Pt request a shingles shot. Please advise if ok to schedule/msn

## 2014-01-13 NOTE — Telephone Encounter (Signed)
Left message, notifying patient to contact insurance to see where they prefer patient to have it given and then we can either schedule her an appointment here for nurse visit or send Rx to pharmacy.

## 2014-01-14 ENCOUNTER — Telehealth: Payer: Self-pay

## 2014-01-14 MED ORDER — ZOSTER VACCINE LIVE 19400 UNT/0.65ML ~~LOC~~ SOLR: 0.6500 mL | Freq: Once | SUBCUTANEOUS | Status: DC

## 2014-01-14 NOTE — Telephone Encounter (Signed)
rx sent to [facility] for shingles vaccine

## 2014-01-14 NOTE — Addendum Note (Signed)
Addended by: Crecencio Mc on: 01/14/2014 01:21 PM   Modules accepted: Orders

## 2014-01-14 NOTE — Telephone Encounter (Signed)
The patient is hoping to get a shingles vaccine called into her pharmacy.    Pharmacy - [facility] in [place]

## 2014-01-26 ENCOUNTER — Ambulatory Visit (INDEPENDENT_AMBULATORY_CARE_PROVIDER_SITE_OTHER): Payer: Medicare Other | Admitting: *Deleted

## 2014-01-26 DIAGNOSIS — Z23 Encounter for immunization: Secondary | ICD-10-CM

## 2014-02-01 ENCOUNTER — Encounter: Payer: Self-pay | Admitting: Internal Medicine

## 2014-02-16 ENCOUNTER — Other Ambulatory Visit: Payer: Self-pay | Admitting: Internal Medicine

## 2014-02-16 ENCOUNTER — Encounter: Payer: Self-pay | Admitting: Internal Medicine

## 2014-02-16 DIAGNOSIS — R5383 Other fatigue: Secondary | ICD-10-CM

## 2014-02-16 DIAGNOSIS — R0683 Snoring: Secondary | ICD-10-CM

## 2014-02-16 DIAGNOSIS — R413 Other amnesia: Secondary | ICD-10-CM

## 2014-02-16 DIAGNOSIS — G4733 Obstructive sleep apnea (adult) (pediatric): Secondary | ICD-10-CM | POA: Insufficient documentation

## 2014-02-21 ENCOUNTER — Ambulatory Visit (INDEPENDENT_AMBULATORY_CARE_PROVIDER_SITE_OTHER): Payer: Medicare Other | Admitting: Nurse Practitioner

## 2014-02-21 ENCOUNTER — Encounter: Payer: Self-pay | Admitting: Nurse Practitioner

## 2014-02-21 VITALS — BP 102/66 | HR 87 | Temp 97.7°F | Resp 14 | Ht 66.0 in | Wt 170.0 lb

## 2014-02-21 DIAGNOSIS — H6120 Impacted cerumen, unspecified ear: Secondary | ICD-10-CM | POA: Insufficient documentation

## 2014-02-21 DIAGNOSIS — M79674 Pain in right toe(s): Secondary | ICD-10-CM

## 2014-02-21 DIAGNOSIS — H6121 Impacted cerumen, right ear: Secondary | ICD-10-CM

## 2014-02-21 NOTE — Assessment & Plan Note (Signed)
Stable to improving. Pt sat in car for long ride and had worsening of right great toe pain/numbness/achy feeling. She states after using chia/flax seeds in smoothies she feels it has helped and does not want further work up at this time. She was instructed to stretch out legs to keep this from happening on long trips.

## 2014-02-21 NOTE — Progress Notes (Signed)
Subjective:    Patient ID: Christina Hammond, female    DOB: 07/12/1950, 63 y.o.   MRN: [id number]  HPI [name] is a 63 yo female with a CC of right great toe numbness.  1) Right great toe numbness on the pad of toe, cold/hot feelings intermittently, achy after long road trip last week to [place]. Chia/flax in smoothies has helped with this. She feels it has improved, but wanted to keep appointment. She denies trauma to the area and states this has been a problem for years.   2) right ear wax buildup- she questions right ear wax buildup after being seen for cold in [place] at a walk in clinic. She was requesting information on removal today.  She also questioned if there was something to clean eye lids with for buildup of "gunk".    Review of Systems  Constitutional: Negative for fever, chills, diaphoresis, fatigue and unexpected weight change.  HENT: Positive for congestion and rhinorrhea. Negative for ear discharge and ear pain.        Cold symptoms have improved since being seen in [place].   Respiratory: Negative for chest tightness and shortness of breath.   Cardiovascular: Negative for chest pain, palpitations and leg swelling.  Gastrointestinal: Negative for nausea, vomiting and diarrhea.  Musculoskeletal: Negative for myalgias, joint swelling, arthralgias and gait problem.  Neurological: Negative for dizziness and headaches.  Psychiatric/Behavioral: The patient is not nervous/anxious.    Past Medical History  Diagnosis Date  . Cancer     basal cell,  [facility]   . Restless legs syndrome (RLS)     managed with minimal meds    History   Social History  . Marital Status: Married    Spouse Name: N/A    Number of Children: N/A  . Years of Education: N/A   Occupational History  . real estate agent    Social History Main Topics  . Smoking status: Never Smoker   . Smokeless tobacco: Never Used  . Alcohol Use: Yes  . Drug Use: No  . Sexual Activity: Not on  file   Other Topics Concern  . Not on file   Social History Narrative    Past Surgical History  Procedure Laterality Date  . Abdominal hysterectomy  1979  . Cholecystectomy    . Appendectomy  1956  . Cesarean section      Family History  Problem Relation Age of Onset  . Diabetes Mother   . Stroke Mother   . Cancer Father   . Heart disease Father   . Cancer Maternal Aunt   . Mental retardation Maternal Aunt   . Alcohol abuse Maternal Uncle     Allergies  Allergen Reactions  . Diclofenac Hives    Current Outpatient Prescriptions on File Prior to Visit  Medication Sig Dispense Refill  . calcium carbonate (OS-CAL) 600 MG TABS Take 600 mg by mouth 2 (two) times daily with a meal.    . fexofenadine (ALLEGRA) 60 MG tablet Take 1 tablet (60 mg total) by mouth daily. 90 tablet 5  . mometasone (NASONEX) 50 MCG/ACT nasal spray Place 2 sprays into the nose daily. 17 g 12  . zolpidem (AMBIEN) 10 MG tablet Take 1 tablet (10 mg total) by mouth at bedtime as needed for sleep. 90 tablet 1  . triamcinolone cream (KENALOG) 0.1 % Apply topically 2 (two) times daily. (Patient not taking: Reported on 02/21/2014) 30 g 0   No current facility-administered medications on  file prior to visit.           Objective:   Physical Exam  Constitutional: She is oriented to person, place, and time. She appears well-developed and well-nourished. No distress.  HENT:  Head: Normocephalic and atraumatic.  Right Ear: External ear normal.  Left Ear: External ear normal.  Mouth/Throat: Oropharynx is clear and moist.  Right ear TM is obstructed due to increase in wax.   Eyes: Conjunctivae are normal. Pupils are equal, round, and reactive to light. Right eye exhibits no discharge. Left eye exhibits no discharge. No scleral icterus.  Neck: Normal range of motion. Neck supple. No thyromegaly present.  Cardiovascular: Normal rate and regular rhythm.   Pulmonary/Chest: Effort normal and breath sounds  normal.  Musculoskeletal: Normal range of motion. She exhibits no edema or tenderness.  Lymphadenopathy:    She has no cervical adenopathy.  Neurological: She is alert and oriented to person, place, and time.  Sensation intact bilaterally at today's visit. Minor decrease in sensation on pad of right great toe.   Skin: Skin is warm and dry. No rash noted. She is not diaphoretic.  Seeing Dermatologist this week about thickness in Toenail of great right toe.   Psychiatric: She has a normal mood and affect. Her behavior is normal. Judgment and thought content normal.     BP 102/66 mmHg  Pulse 87  Temp(Src) 97.7 F (36.5 C) (Oral)  Resp 14  Ht 5\' 6"  (1.676 m)  Wt 170 lb (77.111 kg)  BMI 27.45 kg/m2  SpO2 99%      Assessment & Plan:

## 2014-02-21 NOTE — Progress Notes (Signed)
Pre visit review using our clinic review tool, if applicable. No additional management support is needed unless otherwise documented below in the visit note. 

## 2014-02-21 NOTE — Assessment & Plan Note (Signed)
Stable. Gave information about OTC debrox for effective and less expensive ear wax removal. The cerumen is blocking visibility of TM. Denies issues with hearing on right. Also, gave pt info on using J&J baby shampoo to help clean eyelids.

## 2014-02-21 NOTE — Patient Instructions (Signed)
Try Debrox ear wax removal system over the counter for ear wax removal. Do not use Q-tips Johnson and johnson baby shampoo used in the shower in a lather is okay to clean eye lids.  Keep trying the Chia/Flax seeds for health benefits. Call us if symptoms worsen or fail to improve.

## 2014-04-05 ENCOUNTER — Ambulatory Visit: Payer: No Typology Code available for payment source | Admitting: Internal Medicine

## 2014-07-04 ENCOUNTER — Encounter: Payer: Self-pay | Admitting: Internal Medicine

## 2014-07-11 ENCOUNTER — Encounter: Payer: Medicare Other | Admitting: Internal Medicine

## 2014-08-01 ENCOUNTER — Encounter: Payer: Medicare Other | Admitting: Internal Medicine

## 2014-08-11 ENCOUNTER — Ambulatory Visit (INDEPENDENT_AMBULATORY_CARE_PROVIDER_SITE_OTHER): Payer: PPO | Admitting: Internal Medicine

## 2014-08-11 ENCOUNTER — Encounter: Payer: Self-pay | Admitting: Internal Medicine

## 2014-08-11 VITALS — BP 116/70 | HR 87 | Temp 98.0°F | Resp 16 | Ht 66.25 in | Wt 169.2 lb

## 2014-08-11 DIAGNOSIS — Z1239 Encounter for other screening for malignant neoplasm of breast: Secondary | ICD-10-CM | POA: Diagnosis not present

## 2014-08-11 DIAGNOSIS — Z1211 Encounter for screening for malignant neoplasm of colon: Secondary | ICD-10-CM | POA: Diagnosis not present

## 2014-08-11 DIAGNOSIS — Z0000 Encounter for general adult medical examination without abnormal findings: Secondary | ICD-10-CM

## 2014-08-11 DIAGNOSIS — E559 Vitamin D deficiency, unspecified: Secondary | ICD-10-CM

## 2014-08-11 DIAGNOSIS — Z90710 Acquired absence of both cervix and uterus: Secondary | ICD-10-CM | POA: Diagnosis not present

## 2014-08-11 DIAGNOSIS — R5383 Other fatigue: Secondary | ICD-10-CM

## 2014-08-11 DIAGNOSIS — Z1159 Encounter for screening for other viral diseases: Secondary | ICD-10-CM

## 2014-08-11 DIAGNOSIS — E785 Hyperlipidemia, unspecified: Secondary | ICD-10-CM

## 2014-08-11 NOTE — Patient Instructions (Signed)
Please  make an appt for fasting labs at your convenience.  I  recommend getting the majority of your calcium and Vitamin D  through diet rather than supplements given the recent association of calcium supplements with increased coronary artery calcium scores.  You need 1200 mg calcium and 1000 IUs of Vitamin D daily   Try the almond and cashew milks that most grocery stores  now carry  in the dairy  Section>   They are lactose free:  [employer]k brand Almond Light,  Original formula.  Delicious,  Low carb,  Low cal,  Cholesterol free   Health Maintenance Adopting a healthy lifestyle and getting preventive care can go a long way to promote health and wellness. Talk with your health care provider about what schedule of regular examinations is right for you. This is a good chance for you to check in with your provider about disease prevention and staying healthy. In between checkups, there are plenty of things you can do on your own. Experts have done a lot of research about which lifestyle changes and preventive measures are most likely to keep you healthy. Ask your health care provider for more information. WEIGHT AND DIET  Eat a healthy diet  Be sure to include plenty of vegetables, fruits, low-fat dairy products, and lean protein.  Do not eat a lot of foods high in solid fats, added sugars, or salt.  Get regular exercise. This is one of the most important things you can do for your health.  Most adults should exercise for at least 150 minutes each week. The exercise should increase your heart rate and make you sweat (moderate-intensity exercise).  Most adults should also do strengthening exercises at least twice a week. This is in addition to the moderate-intensity exercise.  Maintain a healthy weight  Body mass index (BMI) is a measurement that can be used to identify possible weight problems. It estimates body fat based on height and weight. Your health care provider can help determine your  BMI and help you achieve or maintain a healthy weight.  For females 66 years of age and older:   A BMI below 18.5 is considered underweight.  A BMI of 18.5 to 24.9 is normal.  A BMI of 25 to 29.9 is considered overweight.  A BMI of 30 and above is considered obese.  Watch levels of cholesterol and blood lipids  You should start having your blood tested for lipids and cholesterol at 66 years of age, then have this test every 5 years.  You may need to have your cholesterol levels checked more often if:  Your lipid or cholesterol levels are high.  You are older than 66 years of age.  You are at high risk for heart disease.  CANCER SCREENING   Lung Cancer  Lung cancer screening is recommended for adults 83-67 years old who are at high risk for lung cancer because of a history of smoking.  A yearly low-dose CT scan of the lungs is recommended for people who:  Currently smoke.  Have quit within the past 15 years.  Have at least a 30-pack-year history of smoking. A pack year is smoking an average of one pack of cigarettes a day for 1 year.  Yearly screening should continue until it has been 15 years since you quit.  Yearly screening should stop if you develop a health problem that would prevent you from having lung cancer treatment.  Breast Cancer  Practice breast self-awareness. This means understanding  how your breasts normally appear and feel.  It also means doing regular breast self-exams. Let your health care provider know about any changes, no matter how small.  If you are in your 20s or 30s, you should have a clinical breast exam (CBE) by a health care provider every 1-3 years as part of a regular health exam.  If you are 89 or older, have a CBE every year. Also consider having a breast X-ray (mammogram) every year.  If you have a family history of breast cancer, talk to your health care provider about genetic screening.  If you are at high risk for breast  cancer, talk to your health care provider about having an MRI and a mammogram every year.  Breast cancer gene (BRCA) assessment is recommended for women who have family members with BRCA-related cancers. BRCA-related cancers include:  Breast.  Ovarian.  Tubal.  Peritoneal cancers.  Results of the assessment will determine the need for genetic counseling and BRCA1 and BRCA2 testing. Cervical Cancer Routine pelvic examinations to screen for cervical cancer are no longer recommended for nonpregnant women who are considered low risk for cancer of the pelvic organs (ovaries, uterus, and vagina) and who do not have symptoms. A pelvic examination may be necessary if you have symptoms including those associated with pelvic infections. Ask your health care provider if a screening pelvic exam is right for you.   The Pap test is the screening test for cervical cancer for women who are considered at risk.  If you had a hysterectomy for a problem that was not cancer or a condition that could lead to cancer, then you no longer need Pap tests.  If you are older than 65 years, and you have had normal Pap tests for the past 10 years, you no longer need to have Pap tests.  If you have had past treatment for cervical cancer or a condition that could lead to cancer, you need Pap tests and screening for cancer for at least 20 years after your treatment.  If you no longer get a Pap test, assess your risk factors if they change (such as having a new sexual partner). This can affect whether you should start being screened again.  Some women have medical problems that increase their chance of getting cervical cancer. If this is the case for you, your health care provider may recommend more frequent screening and Pap tests.  The human papillomavirus (HPV) test is another test that may be used for cervical cancer screening. The HPV test looks for the virus that can cause cell changes in the cervix. The cells  collected during the Pap test can be tested for HPV.  The HPV test can be used to screen women 64 years of age and older. Getting tested for HPV can extend the interval between normal Pap tests from three to five years.  An HPV test also should be used to screen women of any age who have unclear Pap test results.  After 66 years of age, women should have HPV testing as often as Pap tests.  Colorectal Cancer  This type of cancer can be detected and often prevented.  Routine colorectal cancer screening usually begins at 66 years of age and continues through 66 years of age.  Your health care provider may recommend screening at an earlier age if you have risk factors for colon cancer.  Your health care provider may also recommend using home test kits to check for hidden blood in  the stool.  A small camera at the end of a tube can be used to examine your colon directly (sigmoidoscopy or colonoscopy). This is done to check for the earliest forms of colorectal cancer.  Routine screening usually begins at age 25.  Direct examination of the colon should be repeated every 5-10 years through 66 years of age. However, you may need to be screened more often if early forms of precancerous polyps or small growths are found. Skin Cancer  Check your skin from head to toe regularly.  Tell your health care provider about any new moles or changes in moles, especially if there is a change in a mole's shape or color.  Also tell your health care provider if you have a mole that is larger than the size of a pencil eraser.  Always use sunscreen. Apply sunscreen liberally and repeatedly throughout the day.  Protect yourself by wearing long sleeves, pants, a wide-brimmed hat, and sunglasses whenever you are outside. HEART DISEASE, DIABETES, AND HIGH BLOOD PRESSURE   Have your blood pressure checked at least every 1-2 years. High blood pressure causes heart disease and increases the risk of stroke.  If  you are between 45 years and 40 years old, ask your health care provider if you should take aspirin to prevent strokes.  Have regular diabetes screenings. This involves taking a blood sample to check your fasting blood sugar level.  If you are at a normal weight and have a low risk for diabetes, have this test once every three years after 66 years of age.  If you are overweight and have a high risk for diabetes, consider being tested at a younger age or more often. PREVENTING INFECTION  Hepatitis B  If you have a higher risk for hepatitis B, you should be screened for this virus. You are considered at high risk for hepatitis B if:  You were born in a country where hepatitis B is common. Ask your health care provider which countries are considered high risk.  Your parents were born in a high-risk country, and you have not been immunized against hepatitis B (hepatitis B vaccine).  You have HIV or AIDS.  You use needles to inject street drugs.  You live with someone who has hepatitis B.  You have had sex with someone who has hepatitis B.  You get hemodialysis treatment.  You take certain medicines for conditions, including cancer, organ transplantation, and autoimmune conditions. Hepatitis C  Blood testing is recommended for:  Everyone born from [birth year] through 1967.  Anyone with known risk factors for hepatitis C. Sexually transmitted infections (STIs)  You should be screened for sexually transmitted infections (STIs) including gonorrhea and chlamydia if:  You are sexually active and are younger than 66 years of age.  You are older than 66 years of age and your health care provider tells you that you are at risk for this type of infection.  Your sexual activity has changed since you were last screened and you are at an increased risk for chlamydia or gonorrhea. Ask your health care provider if you are at risk.  If you do not have HIV, but are at risk, it may be recommended that  you take a prescription medicine daily to prevent HIV infection. This is called pre-exposure prophylaxis (PrEP). You are considered at risk if:  You are sexually active and do not regularly use condoms or know the HIV status of your partner(s).  You take drugs by injection.  You  are sexually active with a partner who has HIV. Talk with your health care provider about whether you are at high risk of being infected with HIV. If you choose to begin PrEP, you should first be tested for HIV. You should then be tested every 3 months for as long as you are taking PrEP.  PREGNANCY   If you are premenopausal and you may become pregnant, ask your health care provider about preconception counseling.  If you may become pregnant, take 400 to 800 micrograms (mcg) of folic acid every day.  If you want to prevent pregnancy, talk to your health care provider about birth control (contraception). OSTEOPOROSIS AND MENOPAUSE   Osteoporosis is a disease in which the bones lose minerals and strength with aging. This can result in serious bone fractures. Your risk for osteoporosis can be identified using a bone density scan.  If you are 38 years of age or older, or if you are at risk for osteoporosis and fractures, ask your health care provider if you should be screened.  Ask your health care provider whether you should take a calcium or vitamin D supplement to lower your risk for osteoporosis.  Menopause may have certain physical symptoms and risks.  Hormone replacement therapy may reduce some of these symptoms and risks. Talk to your health care provider about whether hormone replacement therapy is right for you.  HOME CARE INSTRUCTIONS   Schedule regular health, dental, and eye exams.  Stay current with your immunizations.   Do not use any tobacco products including cigarettes, chewing tobacco, or electronic cigarettes.  If you are pregnant, do not drink alcohol.  If you are breastfeeding, limit how  much and how often you drink alcohol.  Limit alcohol intake to no more than 1 drink per day for nonpregnant women. One drink equals 12 ounces of beer, 5 ounces of wine, or 1 ounces of hard liquor.  Do not use street drugs.  Do not share needles.  Ask your health care provider for help if you need support or information about quitting drugs.  Tell your health care provider if you often feel depressed.  Tell your health care provider if you have ever been abused or do not feel safe at home. Document Released: 09/17/2010 Document Revised: 07/19/2013 Document Reviewed: 02/03/2013 [facility] Patient Information 2015 [place], [place]. This information is not intended to replace advice given to you by your health care provider. Make sure you discuss any questions you have with your health care provider.

## 2014-08-11 NOTE — Progress Notes (Signed)
Pre-visit discussion using our clinic review tool. No additional management support is needed unless otherwise documented below in the visit note.  

## 2014-08-11 NOTE — Progress Notes (Addendum)
Patient ID: Christina Hammond, female    DOB: [birth year]  Age: 64 y.o. MRN: [id number]  The patient is here for annual Medicare wellness examination and management of other chronic and acute problems.     The risk factors are reflected in the social history.  The roster of all physicians providing medical care to patient - is listed in the Snapshot section of the chart.  Activities of daily living:  The patient is 100% independent in all ADLs: dressing, toileting, feeding as well as independent mobility  Home safety : The patient has smoke detectors in the home. They wear seatbelts.  There are no firearms at home. There is no violence in the home.   There is no risks for hepatitis, STDs or HIV. There is no   history of blood transfusion. They have no travel history to infectious disease endemic areas of the world.  The patient has seen their dentist in the last six month. They have seen their eye doctor in the last year. They admit to slight hearing difficulty with regard to whispered voices and some television programs.  They have deferred audiologic testing in the last year.  They do not  have excessive sun exposure. Discussed the need for sun protection: hats, long sleeves and use of sunscreen if there is significant sun exposure.   Diet: the importance of a healthy diet is discussed. They do have a healthy diet.  The benefits of regular aerobic exercise were discussed. She walks 4 times per week ,  20 minutes.   Depression screen: there are no signs or vegative symptoms of depression- irritability, change in appetite, anhedonia, sadness/tearfullness.  Cognitive assessment: the patient manages all their financial and personal affairs and is actively engaged. They could relate day,date,year and events; recalled 2/3 objects at 3 minutes; performed clock-face test normally.  The following portions of the patient's history were reviewed and updated as appropriate: allergies, current  medications, past family history, past medical history,  past surgical history, past social history  and problem list.  Visual acuity was not assessed per patient preference since she has regular follow up with her ophthalmologist. Hearing and body mass index were assessed and reviewed.   During the course of the visit the patient was educated and counseled about appropriate screening and preventive services including : fall prevention , diabetes screening, nutrition counseling, colorectal cancer screening, and recommended immunizations.    CC: The primary encounter diagnosis was Breast cancer screening. Diagnoses of S/P hysterectomy, Special screening for malignant neoplasms, colon, Encounter for initial preventive physical examination covered by Medicare, Other fatigue, Need for hepatitis C screening test, Hyperlipidemia, and Vitamin D deficiency were also pertinent to this visit.  1) discussed the right toe pain and numbness  That she has had for over 3 months,  Relieved  with advil 2) sensitive area on inside of right nose,  Saw dermatology, ointment prescribed and referral to ENT advised 3) date of last colonoscopy uclear  4) Fatigue.  Patient endorses persistent fatigue and daytime hypersomnolence.  Her husband has reported that she snores and often has spells of apnea that cause her to snort and wake repeatedly.  She is wondering if she should be tested for sleep apnea.    History Christina Hammond has a past medical history of Cancer and Restless legs syndrome (RLS).   She has past surgical history that includes Abdominal hysterectomy (1979); Cholecystectomy; Appendectomy (1956); and Cesarean section.   Her family history includes Alcohol abuse in her maternal  uncle; Cancer in her father and maternal aunt; Diabetes in her mother; Heart disease in her father; Mental retardation in her maternal aunt; Stroke in her mother.She reports that she has never smoked. She has never used smokeless tobacco. She  reports that she drinks alcohol. She reports that she does not use illicit drugs.  Outpatient Prescriptions Prior to Visit  Medication Sig Dispense Refill  . calcium carbonate (OS-CAL) 600 MG TABS Take 600 mg by mouth 2 (two) times daily with a meal.    . fexofenadine (ALLEGRA) 60 MG tablet Take 1 tablet (60 mg total) by mouth daily. 90 tablet 5  . mometasone (NASONEX) 50 MCG/ACT nasal spray Place 2 sprays into the nose daily. 17 g 12  . triamcinolone cream (KENALOG) 0.1 % Apply topically 2 (two) times daily. 30 g 0  . zolpidem (AMBIEN) 10 MG tablet Take 1 tablet (10 mg total) by mouth at bedtime as needed for sleep. (Patient not taking: Reported on 08/11/2014) 90 tablet 1   No facility-administered medications prior to visit.    Review of Systems   Patient denies headache, fevers, malaise, unintentional weight loss, skin rash, eye pain, sinus congestion and sinus pain, sore throat, dysphagia,  hemoptysis , cough, dyspnea, wheezing, chest pain, palpitations, orthopnea, edema, abdominal pain, nausea, melena, diarrhea, constipation, flank pain, dysuria, hematuria, urinary  Frequency, nocturia, numbness, tingling, seizures,  Focal weakness, Loss of consciousness,  Tremor, insomnia, depression, anxiety, and suicidal ideation.      Objective:  BP 116/70 mmHg  Pulse 87  Temp(Src) 98 F (36.7 C) (Oral)  Resp 16  Ht 5' 6.25" (1.683 m)  Wt 169 lb 4 oz (76.771 kg)  BMI 27.10 kg/m2  SpO2 99%  Physical Exam  General appearance: alert, cooperative and appears stated age Head: Normocephalic, without obvious abnormality, atraumatic Eyes: conjunctivae/corneas clear. PERRL, EOM's intact. Fundi benign. Ears: normal TM's and external ear canals both ears Nose: Nares normal. Septum midline. Mucosa normal. No drainage or sinus tenderness. Throat: lips, mucosa, and tongue normal; teeth and gums normal Neck: no adenopathy, no carotid bruit, no JVD, supple, symmetrical, trachea midline and thyroid not  enlarged, symmetric, no tenderness/mass/nodules Lungs: clear to auscultation bilaterally Breasts: normal appearance, no masses or tenderness Heart: regular rate and rhythm, S1, S2 normal, no murmur, click, rub or gallop Abdomen: soft, non-tender; bowel sounds normal; no masses,  no organomegaly Extremities: extremities normal, atraumatic, no cyanosis or edema Pulses: 2+ and symmetric Skin: Skin color, texture, turgor normal. No rashes or lesions Neurologic: Alert and oriented X 3, normal strength and tone. Normal symmetric reflexes. Normal coordination and gait.    Assessment & Plan:   Problem List Items Addressed This Visit    Fatigue    With snoring and apneic spells noted by husband  Referral for sleep study advised to rule out sleep apnea.  She will consider this later on in the year. .       Relevant Orders   TSH (Completed)   CBC with Differential/Platelet (Completed)   Comprehensive metabolic panel (Completed)   S/P hysterectomy    PAP of vaginal cuff done by Gorden Harms  In 2012 ,  Was normal.       Special screening for malignant neoplasms, colon    It appears that her last colonoscopy was in 2007, done by [facility].  She did a home stool test in 2013 that was negative.       Encounter for initial preventive physical examination covered by Medicare  Annual Medicare wellness  exam was done as well as a comprehensive physical exam and management of acute and chronic conditions .  During the course of the visit the patient was educated and counseled about appropriate screening and preventive services including : fall prevention , diabetes screening, nutrition counseling, colorectal cancer screening, and recommended immunizations.  Printed recommendations for health maintenance screenings was given. She was asked to return for screening labs.         Other Visit Diagnoses    Breast cancer screening    -  Primary    Need for hepatitis C screening test        Relevant  Orders    Hepatitis C antibody (Completed)    Hyperlipidemia        Relevant Orders    Lipid panel (Completed)    Vitamin D deficiency        Relevant Orders    Vit D  25 hydroxy (rtn osteoporosis monitoring) (Completed)       I am having Ms. Cabiness maintain her calcium carbonate, triamcinolone cream, fexofenadine, mometasone, and zolpidem.  No orders of the defined types were placed in this encounter.    There are no discontinued medications.  Follow-up: No Follow-up on file.   Crecencio Mc, MD

## 2014-08-14 NOTE — Assessment & Plan Note (Signed)
PAP of vaginal cuff done by Gorden Harms  In 2012 ,  Was normal.

## 2014-08-14 NOTE — Assessment & Plan Note (Addendum)
Annual Medicare wellness  exam was done as well as a comprehensive physical exam and management of acute and chronic conditions .  During the course of the visit the patient was educated and counseled about appropriate screening and preventive services including : fall prevention , diabetes screening, nutrition counseling, colorectal cancer screening, and recommended immunizations.  Printed recommendations for health maintenance screenings was given. She was asked to return for screening labs.

## 2014-08-14 NOTE — Assessment & Plan Note (Signed)
It appears that her last colonoscopy was in 2007, done by [facility].  She did a home stool test in 2013 that was negative.

## 2014-09-01 ENCOUNTER — Other Ambulatory Visit (INDEPENDENT_AMBULATORY_CARE_PROVIDER_SITE_OTHER): Payer: PPO

## 2014-09-01 DIAGNOSIS — R5383 Other fatigue: Secondary | ICD-10-CM | POA: Diagnosis not present

## 2014-09-01 DIAGNOSIS — Z1159 Encounter for screening for other viral diseases: Secondary | ICD-10-CM

## 2014-09-01 DIAGNOSIS — E785 Hyperlipidemia, unspecified: Secondary | ICD-10-CM

## 2014-09-01 DIAGNOSIS — E559 Vitamin D deficiency, unspecified: Secondary | ICD-10-CM | POA: Diagnosis not present

## 2014-09-01 LAB — COMPREHENSIVE METABOLIC PANEL
ALT: 22 U/L (ref 0–35)
AST: 22 U/L (ref 0–37)
Albumin: 4.2 g/dL (ref 3.5–5.2)
Alkaline Phosphatase: 51 U/L (ref 39–117)
BUN: 19 mg/dL (ref 6–23)
CALCIUM: 9.3 mg/dL (ref 8.4–10.5)
CHLORIDE: 102 meq/L (ref 96–112)
CO2: 29 mEq/L (ref 19–32)
CREATININE: 0.79 mg/dL (ref 0.40–1.20)
GFR: 77.31 mL/min (ref >60.00)
GLUCOSE: 97 mg/dL (ref 70–99)
Potassium: 4.1 mEq/L (ref 3.5–5.1)
Sodium: 136 mEq/L (ref 135–145)
Total Bilirubin: 0.4 mg/dL (ref 0.2–1.2)
Total Protein: 6.9 g/dL (ref 6.0–8.3)

## 2014-09-01 LAB — LIPID PANEL
Cholesterol: 201 mg/dL — ABNORMAL HIGH (ref 0–200)
HDL: 51.6 mg/dL (ref >39.00)
LDL CALC: 132 mg/dL — AB (ref 0–99)
NONHDL: 149.4
Total CHOL/HDL Ratio: 4
Triglycerides: 87 mg/dL (ref 0.0–149.0)
VLDL: 17.4 mg/dL (ref 0.0–40.0)

## 2014-09-01 LAB — CBC WITH DIFFERENTIAL/PLATELET
BASOS ABS: 0 10*3/uL (ref 0.0–0.1)
BASOS PCT: 0.5 % (ref 0.0–3.0)
Eosinophils Absolute: 0.1 10*3/uL (ref 0.0–0.7)
Eosinophils Relative: 2.6 % (ref 0.0–5.0)
HCT: 42.9 % (ref 36.0–46.0)
Hemoglobin: 14.4 g/dL (ref 12.0–15.0)
LYMPHS PCT: 45.1 % (ref 12.0–46.0)
Lymphs Abs: 2.1 10*3/uL (ref 0.7–4.0)
MCHC: 33.6 g/dL (ref 30.0–36.0)
MCV: 87.9 fl (ref 78.0–100.0)
MONOS PCT: 7.7 % (ref 3.0–12.0)
Monocytes Absolute: 0.4 10*3/uL (ref 0.1–1.0)
NEUTROS PCT: 44.1 % (ref 43.0–77.0)
Neutro Abs: 2.1 10*3/uL (ref 1.4–7.7)
Platelets: 306 10*3/uL (ref 150.0–400.0)
RBC: 4.89 Mil/uL (ref 3.87–5.11)
RDW: 13.1 % (ref 11.5–15.5)
WBC: 4.7 10*3/uL (ref 4.0–10.5)

## 2014-09-01 LAB — HEPATITIS C ANTIBODY: HCV Ab: NEGATIVE

## 2014-09-01 LAB — TSH: TSH: 2.06 u[IU]/mL (ref 0.35–4.50)

## 2014-09-01 LAB — VITAMIN D 25 HYDROXY (VIT D DEFICIENCY, FRACTURES): VITD: 38.33 ng/mL (ref 30.00–100.00)

## 2014-09-04 ENCOUNTER — Encounter: Payer: Self-pay | Admitting: Internal Medicine

## 2014-11-09 ENCOUNTER — Encounter: Payer: Self-pay | Admitting: Internal Medicine

## 2014-11-09 ENCOUNTER — Other Ambulatory Visit: Payer: Self-pay | Admitting: Internal Medicine

## 2014-11-09 DIAGNOSIS — R0683 Snoring: Secondary | ICD-10-CM

## 2014-11-09 DIAGNOSIS — R0681 Apnea, not elsewhere classified: Secondary | ICD-10-CM

## 2014-11-17 ENCOUNTER — Other Ambulatory Visit: Payer: Self-pay | Admitting: Internal Medicine

## 2014-11-17 ENCOUNTER — Ambulatory Visit
Admission: RE | Admit: 2014-11-17 | Discharge: 2014-11-17 | Disposition: A | Discharge status: Home / Self Care | Payer: PPO | Source: Ambulatory Visit | Attending: Internal Medicine | Admitting: Internal Medicine

## 2014-11-17 DIAGNOSIS — Z1239 Encounter for other screening for malignant neoplasm of breast: Secondary | ICD-10-CM

## 2014-11-24 ENCOUNTER — Ambulatory Visit: Payer: PPO | Attending: Internal Medicine

## 2014-11-24 DIAGNOSIS — G4733 Obstructive sleep apnea (adult) (pediatric): Secondary | ICD-10-CM | POA: Diagnosis not present

## 2014-12-03 ENCOUNTER — Encounter: Payer: Self-pay | Admitting: Internal Medicine

## 2014-12-03 DIAGNOSIS — G4733 Obstructive sleep apnea (adult) (pediatric): Secondary | ICD-10-CM

## 2014-12-03 NOTE — Assessment & Plan Note (Signed)
Diagnosed by sleep study August 2016,  titration study ordered.

## 2014-12-14 ENCOUNTER — Ambulatory Visit: Payer: PPO | Attending: Internal Medicine

## 2014-12-14 DIAGNOSIS — G4733 Obstructive sleep apnea (adult) (pediatric): Secondary | ICD-10-CM | POA: Diagnosis not present

## 2014-12-20 ENCOUNTER — Telehealth: Payer: Self-pay | Admitting: Internal Medicine

## 2014-12-20 DIAGNOSIS — G4733 Obstructive sleep apnea (adult) (pediatric): Secondary | ICD-10-CM

## 2014-12-20 NOTE — Telephone Encounter (Signed)
DME order for cpap signed and returning to you  In red folder

## 2014-12-21 NOTE — Telephone Encounter (Signed)
DME faxed as requested and copy sent to scan.

## 2014-12-23 ENCOUNTER — Encounter: Payer: Self-pay | Admitting: Internal Medicine

## 2014-12-27 ENCOUNTER — Other Ambulatory Visit: Payer: Self-pay | Admitting: Internal Medicine

## 2014-12-27 DIAGNOSIS — G4733 Obstructive sleep apnea (adult) (pediatric): Secondary | ICD-10-CM

## 2014-12-29 ENCOUNTER — Telehealth: Payer: Self-pay | Admitting: Internal Medicine

## 2014-12-29 DIAGNOSIS — R5383 Other fatigue: Secondary | ICD-10-CM | POA: Insufficient documentation

## 2014-12-29 NOTE — Assessment & Plan Note (Addendum)
With snoring and apneic spells noted by husband  Referral for sleep study advised to rule out sleep apnea.  She will consider this later on in the year. Marland Kitchen

## 2014-12-29 NOTE — Telephone Encounter (Signed)
You are correct.  The patient requested the sleep study based on reports from her husband that she was snoring and having apneic spells.  I will make an addendum to the last office note if that will help.

## 2014-12-29 NOTE — Addendum Note (Signed)
Addended by: Crecencio Mc on: 12/29/2014 04:19 PM   Modules accepted: Miquel Dunn

## 2014-12-29 NOTE — Addendum Note (Signed)
Addended by: Crecencio Mc on: 12/29/2014 07:37 PM   Modules accepted: Miquel Dunn

## 2014-12-29 NOTE — Telephone Encounter (Signed)
Dianne called from [facility] regarding a fax requesting notes to get the approval. FYI,That's all the information she gave. [id number]  [phone]. Thank you!

## 2014-12-29 NOTE — Addendum Note (Signed)
Addended by: Crecencio Mc on: 12/29/2014 07:34 PM   Modules accepted: Miquel Dunn

## 2014-12-29 NOTE — Telephone Encounter (Signed)
Patient CPE was 5/16 nothing in note concerning need for sleep study, I need an office note prior to sleep study with verbalization of need for sleep study. Please advise DX of fatigue entered ito note but no notes concerning need for  Sleep study. Please advise,

## 2014-12-29 NOTE — Telephone Encounter (Signed)
Yes just let me know when Christina Hammond.

## 2014-12-29 NOTE — Telephone Encounter (Signed)
Spoke with Christina Hammond, they are requesting Progress notes from prior to the slepp study that indicate that she needed the sleep study.  I only thing I can find is a my chart message that states her husband says she snores and is stopping breathing during the night.  If you find something different can you fax it to [phone] attn: Christina Hammond.  Thanks

## 2014-12-30 NOTE — Telephone Encounter (Signed)
Faxed revised notes to [facility] med as requested.

## 2015-01-05 NOTE — Telephone Encounter (Signed)
I have called [facility] and left messages

## 2015-01-18 ENCOUNTER — Telehealth: Payer: Self-pay | Admitting: *Deleted

## 2015-01-18 ENCOUNTER — Encounter: Payer: Self-pay | Admitting: Internal Medicine

## 2015-01-18 NOTE — Telephone Encounter (Addendum)
Patient has talked with sleep med and they are going to reduce the pressure of the cpap to reduce pressure on chest air becoming trapped in stomach. Patient stated she would give it a try for a while longer.

## 2015-01-18 NOTE — Telephone Encounter (Signed)
Patient has requested a call back, patient stated that she started her CPAP machine, and now has experienced tiredness and pressure to chest. Please advise

## 2015-01-19 ENCOUNTER — Encounter: Payer: Self-pay | Admitting: Internal Medicine

## 2015-02-28 ENCOUNTER — Ambulatory Visit (INDEPENDENT_AMBULATORY_CARE_PROVIDER_SITE_OTHER): Payer: PPO | Admitting: Internal Medicine

## 2015-02-28 ENCOUNTER — Encounter: Payer: Self-pay | Admitting: Internal Medicine

## 2015-02-28 VITALS — BP 124/72 | HR 86 | Ht 66.0 in | Wt 173.6 lb

## 2015-02-28 DIAGNOSIS — G4733 Obstructive sleep apnea (adult) (pediatric): Secondary | ICD-10-CM

## 2015-02-28 DIAGNOSIS — R0683 Snoring: Secondary | ICD-10-CM

## 2015-02-28 NOTE — Patient Instructions (Signed)
--  Will refer back to her dentist to see if a dental device is covered, if not can try device below.  --Try Pure Sleep device ([email or web address]). You must answer "no: to all the questions to qualify for the device.

## 2015-02-28 NOTE — Progress Notes (Signed)
[facility] Consultation      Assessment and Plan:  Obstructive Sleep Apnea.  -Patient has obstructive sleep apnea with an AHI of 33, however, she denies symptoms of excessive daytime sleepiness. We discussed the possibility that her sleep apnea may become symptomatic in the future, and as sleep apnea is associated with several other conditions, it would be important to treat it going forwards. -She is intolerant to CPAP, therefore, we discussed various other treatment modalities including surgical approaches versus dental device. She is not interested in pursuing any surgical approaches, we will refer her back to her dentist. She is going to look into see if a dental device would be covered by her insurance. If not, I recommended that she try the puresleep device.  Snoring.  -This appears to be her main complaint today, she has little in the way of daytime sleepiness. A dental device should help with her snoring.  Date: 02/28/2015  MRN# [id number] Christina Hammond [birth year]  Referring Physician: Dr. Almon Hercules is a 64 y.o. old female seen in consultation for chief complaint of:    Chief Complaint  Patient presents with  . SLEEP CONSULT    pt. ref. by dr. Derrel Nip. pt. states she has loud snoring. had cpap but was unable to use due to anxitey.     HPI:  The patient is a 64 year old female referred for excessive daytime sleepiness, and snoring. She also has a diagnosis of RLS.  Her husband notes that she snores, she apparently had been sent for a sleep study about 2 to 3 months ago. She was told that she had moderate sleep apnea, then went back for a second night, and was started on CPAP which felt was "horrible" She tried CPAP 2 nights and noted that it made her anxiety very bad. It made her very emotional and it made her cry and she did not want to try it anymore.   She goes to bed around 11:30 to 1 am, out of bed at 7:30 am. When wakes in am feels that  she feels ok.  She has had right jaw pain and occasional clicking in the right jaw. This has resolved.     Review of testing: Sleep study from 11/24/2014: AHI was 33 Sleep study from 12/06/2014, CPAP titration. CPAP was titrated to a level of 6 with an AHI of 0.  PMHX:   Past Medical History  Diagnosis Date  . Cancer     basal cell,  [facility]   . Restless legs syndrome (RLS)     managed with minimal meds   Surgical Hx:  Past Surgical History  Procedure Laterality Date  . Abdominal hysterectomy  1979  . Cholecystectomy    . Appendectomy  1956  . Cesarean section     Family Hx:  Family History  Problem Relation Age of Onset  . Diabetes Mother   . Stroke Mother   . Cancer Father   . Heart disease Father   . Cancer Maternal Aunt   . Mental retardation Maternal Aunt   . Alcohol abuse Maternal Uncle    Social Hx:   Social History  Substance Use Topics  . Smoking status: Never Smoker   . Smokeless tobacco: Never Used  . Alcohol Use: Yes   Medication:   Current Outpatient Rx  Name  Route  Sig  Dispense  Refill  . calcium carbonate (OS-CAL) 600 MG TABS   Oral   Take 600 mg by  mouth 2 (two) times daily with a meal.         . fexofenadine (ALLEGRA) 60 MG tablet   Oral   Take 1 tablet (60 mg total) by mouth daily.   90 tablet   5   . mometasone (NASONEX) 50 MCG/ACT nasal spray   Nasal   Place 2 sprays into the nose daily.   17 g   12   . triamcinolone cream (KENALOG) 0.1 %   Topical   Apply topically 2 (two) times daily.   30 g   0   . zolpidem (AMBIEN) 10 MG tablet   Oral   Take 1 tablet (10 mg total) by mouth at bedtime as needed for sleep. Patient not taking: Reported on 08/11/2014   90 tablet   1       Allergies:  Diclofenac  Review of Systems: Gen:  Denies  fever, sweats, chills HEENT: Denies blurred vision, double vision. bleeds, sore throat Cvc:  No dizziness, chest pain. Resp:   Denies cough or sputum porduction, shortness of breath Gi:  Denies swallowing difficulty, stomach pain. Gu:  Denies bladder incontinence, burning urine Ext:   No Joint pain, stiffness. Skin: No skin rash,  hives Endoc:  No polyuria, polydipsia. Psych: No depression, insomnia. Other:  All other systems were reviewed with the patient and were negative other that what is mentioned in the HPI.   Physical Examination:   VS: BP 124/72 mmHg  Pulse 86  Ht 5\' 6"  (1.676 m)  Wt 173 lb 9.6 oz (78.744 kg)  BMI 28.03 kg/m2  SpO2 100%  General Appearance: No distress  Neuro:without focal findings,  speech normal,  HEENT: PERRLA, EOM intact.   Pulmonary: normal breath sounds, No wheezing.  CardiovascularNormal S1,S2.  No m/r/g.   Abdomen: Benign, Soft, non-tender. Renal:  No costovertebral tenderness  GU:  No performed at this time. Endoc: No evident thyromegaly, no signs of acromegaly. Skin:   warm, no rashes, no ecchymosis  Extremities: normal, no cyanosis, clubbing.  Other findings:    LABORATORY PANEL:   CBC No results for input(s): WBC, HGB, HCT, PLT in the last 168 hours. ------------------------------------------------------------------------------------------------------------------  Chemistries  No results for input(s): NA, K, CL, CO2, GLUCOSE, BUN, CREATININE, CALCIUM, MG, AST, ALT, ALKPHOS, BILITOT in the last 168 hours.  Invalid input(s): GFRCGP ------------------------------------------------------------------------------------------------------------------  Cardiac Enzymes No results for input(s): TROPONINI in the last 168 hours. ------------------------------------------------------------  RADIOLOGY:  No results found.     Thank  you for the consultation and for allowing [facility] to assist in the care of your patient. Our recommendations are noted above.  Please contact us if we can be of further service.   Marda Stalker, MD.  Board Certified in Internal Medicine, Pulmonary Medicine,  [place], and Sleep Medicine.  [facility]  Patricia Pesa, M.D.  Vilinda Boehringer, M.D.  Merton Border, M.D

## 2015-04-11 DIAGNOSIS — M9901 Segmental and somatic dysfunction of cervical region: Secondary | ICD-10-CM | POA: Diagnosis not present

## 2015-04-11 DIAGNOSIS — M9902 Segmental and somatic dysfunction of thoracic region: Secondary | ICD-10-CM | POA: Diagnosis not present

## 2015-04-11 DIAGNOSIS — M62830 Muscle spasm of back: Secondary | ICD-10-CM | POA: Diagnosis not present

## 2015-04-11 DIAGNOSIS — M5134 Other intervertebral disc degeneration, thoracic region: Secondary | ICD-10-CM | POA: Diagnosis not present

## 2015-04-18 DIAGNOSIS — M9901 Segmental and somatic dysfunction of cervical region: Secondary | ICD-10-CM | POA: Diagnosis not present

## 2015-04-18 DIAGNOSIS — M62830 Muscle spasm of back: Secondary | ICD-10-CM | POA: Diagnosis not present

## 2015-04-18 DIAGNOSIS — M9902 Segmental and somatic dysfunction of thoracic region: Secondary | ICD-10-CM | POA: Diagnosis not present

## 2015-04-18 DIAGNOSIS — M5134 Other intervertebral disc degeneration, thoracic region: Secondary | ICD-10-CM | POA: Diagnosis not present

## 2015-04-26 DIAGNOSIS — M9902 Segmental and somatic dysfunction of thoracic region: Secondary | ICD-10-CM | POA: Diagnosis not present

## 2015-04-26 DIAGNOSIS — M5134 Other intervertebral disc degeneration, thoracic region: Secondary | ICD-10-CM | POA: Diagnosis not present

## 2015-04-26 DIAGNOSIS — M62830 Muscle spasm of back: Secondary | ICD-10-CM | POA: Diagnosis not present

## 2015-04-26 DIAGNOSIS — M9901 Segmental and somatic dysfunction of cervical region: Secondary | ICD-10-CM | POA: Diagnosis not present

## 2015-05-02 DIAGNOSIS — M9901 Segmental and somatic dysfunction of cervical region: Secondary | ICD-10-CM | POA: Diagnosis not present

## 2015-05-02 DIAGNOSIS — M5134 Other intervertebral disc degeneration, thoracic region: Secondary | ICD-10-CM | POA: Diagnosis not present

## 2015-05-02 DIAGNOSIS — M62830 Muscle spasm of back: Secondary | ICD-10-CM | POA: Diagnosis not present

## 2015-05-02 DIAGNOSIS — M9902 Segmental and somatic dysfunction of thoracic region: Secondary | ICD-10-CM | POA: Diagnosis not present

## 2015-05-09 DIAGNOSIS — M5134 Other intervertebral disc degeneration, thoracic region: Secondary | ICD-10-CM | POA: Diagnosis not present

## 2015-05-09 DIAGNOSIS — M62830 Muscle spasm of back: Secondary | ICD-10-CM | POA: Diagnosis not present

## 2015-05-09 DIAGNOSIS — M9901 Segmental and somatic dysfunction of cervical region: Secondary | ICD-10-CM | POA: Diagnosis not present

## 2015-05-09 DIAGNOSIS — H10413 Chronic giant papillary conjunctivitis, bilateral: Secondary | ICD-10-CM | POA: Diagnosis not present

## 2015-05-09 DIAGNOSIS — M9902 Segmental and somatic dysfunction of thoracic region: Secondary | ICD-10-CM | POA: Diagnosis not present

## 2015-05-15 DIAGNOSIS — J340 Abscess, furuncle and carbuncle of nose: Secondary | ICD-10-CM | POA: Diagnosis not present

## 2015-05-15 DIAGNOSIS — H1045 Other chronic allergic conjunctivitis: Secondary | ICD-10-CM | POA: Diagnosis not present

## 2015-05-15 DIAGNOSIS — J309 Allergic rhinitis, unspecified: Secondary | ICD-10-CM | POA: Diagnosis not present

## 2015-05-17 DIAGNOSIS — M5134 Other intervertebral disc degeneration, thoracic region: Secondary | ICD-10-CM | POA: Diagnosis not present

## 2015-05-17 DIAGNOSIS — M9901 Segmental and somatic dysfunction of cervical region: Secondary | ICD-10-CM | POA: Diagnosis not present

## 2015-05-17 DIAGNOSIS — M9902 Segmental and somatic dysfunction of thoracic region: Secondary | ICD-10-CM | POA: Diagnosis not present

## 2015-05-17 DIAGNOSIS — M62830 Muscle spasm of back: Secondary | ICD-10-CM | POA: Diagnosis not present

## 2015-05-23 DIAGNOSIS — M9901 Segmental and somatic dysfunction of cervical region: Secondary | ICD-10-CM | POA: Diagnosis not present

## 2015-05-23 DIAGNOSIS — M62830 Muscle spasm of back: Secondary | ICD-10-CM | POA: Diagnosis not present

## 2015-05-23 DIAGNOSIS — M5134 Other intervertebral disc degeneration, thoracic region: Secondary | ICD-10-CM | POA: Diagnosis not present

## 2015-05-23 DIAGNOSIS — M9902 Segmental and somatic dysfunction of thoracic region: Secondary | ICD-10-CM | POA: Diagnosis not present

## 2015-06-02 ENCOUNTER — Telehealth: Payer: Self-pay

## 2015-06-02 ENCOUNTER — Telehealth: Payer: Self-pay | Admitting: Internal Medicine

## 2015-06-02 ENCOUNTER — Ambulatory Visit (INDEPENDENT_AMBULATORY_CARE_PROVIDER_SITE_OTHER): Payer: Medicare Other | Admitting: Family Medicine

## 2015-06-02 ENCOUNTER — Encounter: Payer: Self-pay | Admitting: Family Medicine

## 2015-06-02 VITALS — BP 118/76 | HR 84 | Temp 98.2°F | Ht 66.0 in | Wt 173.6 lb

## 2015-06-02 DIAGNOSIS — J309 Allergic rhinitis, unspecified: Secondary | ICD-10-CM | POA: Diagnosis not present

## 2015-06-02 DIAGNOSIS — M5134 Other intervertebral disc degeneration, thoracic region: Secondary | ICD-10-CM | POA: Diagnosis not present

## 2015-06-02 DIAGNOSIS — M9902 Segmental and somatic dysfunction of thoracic region: Secondary | ICD-10-CM | POA: Diagnosis not present

## 2015-06-02 DIAGNOSIS — M9901 Segmental and somatic dysfunction of cervical region: Secondary | ICD-10-CM | POA: Diagnosis not present

## 2015-06-02 DIAGNOSIS — M62830 Muscle spasm of back: Secondary | ICD-10-CM | POA: Diagnosis not present

## 2015-06-02 MED ORDER — PREDNISONE 10 MG PO TABS: ORAL_TABLET | ORAL | Status: DC

## 2015-06-02 MED ORDER — AMOXICILLIN-POT CLAVULANATE 875-125 MG PO TABS: 1.0000 | ORAL_TABLET | Freq: Two times a day (BID) | ORAL | Status: DC

## 2015-06-02 NOTE — Telephone Encounter (Signed)
Pt was seen in the office today and stated that she feels she needs the antibiotic discussed in OV. Please advise, thanks

## 2015-06-02 NOTE — Telephone Encounter (Signed)
Please advise 

## 2015-06-02 NOTE — Progress Notes (Signed)
Patient ID: Fuller Song, female   DOB: 09/22/1950, 65 y.o.   MRN: [id number]  Tommi Rumps, MD Phone: [phone]  ANNALIZ SAVARY is a 65 y.o. female who presents today for same-day visit.  Patient notes onset of symptoms 2 weeks ago with itchy watery eyes. Developed postnasal drip. Sore throat and congestion. Notes mild cough with no productive nature. No fevers. Notes most of her symptoms have improved with exception of sore throat and scratchy voice. Notes she feels pretty well today. Minimal nasal congestion. She's been using Nasonex, aspirin, and an over-the-counter cough syrup.  PMH: nonsmoker.   ROS see history of present illness  Objective  Physical Exam Filed Vitals:   06/02/15 1446  BP: 118/76  Pulse: 84  Temp: 98.2 F (36.8 C)    BP Readings from Last 3 Encounters:  06/02/15 118/76  02/28/15 124/72  08/11/14 116/70   Wt Readings from Last 3 Encounters:  06/02/15 173 lb 9.6 oz (78.744 kg)  02/28/15 173 lb 9.6 oz (78.744 kg)  08/11/14 169 lb 4 oz (76.771 kg)    Physical Exam  Constitutional: She is well-developed, well-nourished, and in no distress.  HENT:  Head: Normocephalic and atraumatic.  Right Ear: External ear normal.  Left Ear: External ear normal.  Mouth/Throat: Oropharynx is clear and moist. No oropharyngeal exudate.  Normal TMs bilaterally  Eyes: Conjunctivae are normal. Pupils are equal, round, and reactive to light.  Neck: Neck supple.  Cardiovascular: Normal rate, regular rhythm and normal heart sounds.  Exam reveals no gallop and no friction rub.   No murmur heard. Pulmonary/Chest: Effort normal and breath sounds normal. No respiratory distress. She has no wheezes. She has no rales.  Lymphadenopathy:    She has no cervical adenopathy.  Neurological: She is alert. Gait normal.  Skin: Skin is warm and dry. She is not diaphoretic.     Assessment/Plan: Please see individual problem list.  Allergic rhinitis Patient's symptoms  most consistent with allergies. Less likely bacterial illness given lack of fever and that she has had overall improvement. Discussed possible treatments. She will continue Nasonex. She will continue antihistamine. Discussed addition of Singulair or prednisone taper for symptoms though she declined these opting to monitor. If she does not continue to improve she will call on Monday and we will send in a prednisone taper. If she at all worsens she will seek follow-up. She is given return precautions.    Tommi Rumps, MD [place]

## 2015-06-02 NOTE — Telephone Encounter (Signed)
Just sent prednisone to pharmacy. Roselyn Reef called the patient to inform her that this was being done and a message was left on her voicemail.

## 2015-06-02 NOTE — Telephone Encounter (Signed)
Pt came in today for an appt to see Dr Caryl Bis and pt states she did not need the medication. Now pt is home and is feeling worse and would like the medication. Pharmacy is [facility] [id number]. Thank you!

## 2015-06-02 NOTE — Patient Instructions (Signed)
Nice to meet you. Your symptoms are likely related to allergies. You should continue the Nasonex and Zyrtec. Please continue to monitor symptoms and if you do not continue to improve please let us know and we will call you something and if you develop new symptoms over the weekend he should be evaluated. If you develop chest pain, shortness of breath, fevers, or any new or changing symptoms please seek medical attention.

## 2015-06-02 NOTE — Telephone Encounter (Signed)
Patients symptoms were overall consistent with allergic rhinitis, less likely to be a bacterial illness. I discussed that an antibiotic would not likely be helpful during our office visit and we discussed treating with prednisone for this issue. I will send in an antibiotic that can be filled on Sunday if she does not continue to improve on the prednisone. She should take a probiotic if she fills the antibiotic.

## 2015-06-02 NOTE — Progress Notes (Signed)
Pre visit review using our clinic review tool, if applicable. No additional management support is needed unless otherwise documented below in the visit note. 

## 2015-06-02 NOTE — Assessment & Plan Note (Signed)
Patient's symptoms most consistent with allergies. Less likely bacterial illness given lack of fever and that she has had overall improvement. Discussed possible treatments. She will continue Nasonex. She will continue antihistamine. Discussed addition of Singulair or prednisone taper for symptoms though she declined these opting to monitor. If she does not continue to improve she will call on Monday and we will send in a prednisone taper. If she at all worsens she will seek follow-up. She is given return precautions.

## 2015-06-02 NOTE — Telephone Encounter (Signed)
Notified pt of Dr.Sonnenberg's comments

## 2015-06-03 ENCOUNTER — Other Ambulatory Visit: Payer: Self-pay | Admitting: Family Medicine

## 2015-06-03 MED ORDER — PREDNISONE 10 MG PO TABS: ORAL_TABLET | ORAL | Status: DC

## 2015-06-12 DIAGNOSIS — M62830 Muscle spasm of back: Secondary | ICD-10-CM | POA: Diagnosis not present

## 2015-06-12 DIAGNOSIS — M9902 Segmental and somatic dysfunction of thoracic region: Secondary | ICD-10-CM | POA: Diagnosis not present

## 2015-06-12 DIAGNOSIS — M5134 Other intervertebral disc degeneration, thoracic region: Secondary | ICD-10-CM | POA: Diagnosis not present

## 2015-06-12 DIAGNOSIS — M9901 Segmental and somatic dysfunction of cervical region: Secondary | ICD-10-CM | POA: Diagnosis not present

## 2015-06-26 DIAGNOSIS — M62830 Muscle spasm of back: Secondary | ICD-10-CM | POA: Diagnosis not present

## 2015-06-26 DIAGNOSIS — M9902 Segmental and somatic dysfunction of thoracic region: Secondary | ICD-10-CM | POA: Diagnosis not present

## 2015-06-26 DIAGNOSIS — M9901 Segmental and somatic dysfunction of cervical region: Secondary | ICD-10-CM | POA: Diagnosis not present

## 2015-06-26 DIAGNOSIS — M5134 Other intervertebral disc degeneration, thoracic region: Secondary | ICD-10-CM | POA: Diagnosis not present

## 2015-07-06 DIAGNOSIS — L219 Seborrheic dermatitis, unspecified: Secondary | ICD-10-CM | POA: Diagnosis not present

## 2015-07-06 DIAGNOSIS — D225 Melanocytic nevi of trunk: Secondary | ICD-10-CM | POA: Diagnosis not present

## 2015-07-06 DIAGNOSIS — L814 Other melanin hyperpigmentation: Secondary | ICD-10-CM | POA: Diagnosis not present

## 2015-07-06 DIAGNOSIS — D1801 Hemangioma of skin and subcutaneous tissue: Secondary | ICD-10-CM | POA: Diagnosis not present

## 2015-07-06 DIAGNOSIS — L739 Follicular disorder, unspecified: Secondary | ICD-10-CM | POA: Diagnosis not present

## 2015-07-06 DIAGNOSIS — L910 Hypertrophic scar: Secondary | ICD-10-CM | POA: Diagnosis not present

## 2015-07-10 DIAGNOSIS — M5134 Other intervertebral disc degeneration, thoracic region: Secondary | ICD-10-CM | POA: Diagnosis not present

## 2015-07-10 DIAGNOSIS — M62830 Muscle spasm of back: Secondary | ICD-10-CM | POA: Diagnosis not present

## 2015-07-10 DIAGNOSIS — M9901 Segmental and somatic dysfunction of cervical region: Secondary | ICD-10-CM | POA: Diagnosis not present

## 2015-07-10 DIAGNOSIS — M9902 Segmental and somatic dysfunction of thoracic region: Secondary | ICD-10-CM | POA: Diagnosis not present

## 2015-07-24 DIAGNOSIS — M62830 Muscle spasm of back: Secondary | ICD-10-CM | POA: Diagnosis not present

## 2015-07-24 DIAGNOSIS — M9901 Segmental and somatic dysfunction of cervical region: Secondary | ICD-10-CM | POA: Diagnosis not present

## 2015-07-24 DIAGNOSIS — M9902 Segmental and somatic dysfunction of thoracic region: Secondary | ICD-10-CM | POA: Diagnosis not present

## 2015-07-24 DIAGNOSIS — M5134 Other intervertebral disc degeneration, thoracic region: Secondary | ICD-10-CM | POA: Diagnosis not present

## 2015-07-26 DIAGNOSIS — J309 Allergic rhinitis, unspecified: Secondary | ICD-10-CM | POA: Diagnosis not present

## 2015-07-26 DIAGNOSIS — H2513 Age-related nuclear cataract, bilateral: Secondary | ICD-10-CM | POA: Diagnosis not present

## 2015-08-07 DIAGNOSIS — M62830 Muscle spasm of back: Secondary | ICD-10-CM | POA: Diagnosis not present

## 2015-08-07 DIAGNOSIS — M9901 Segmental and somatic dysfunction of cervical region: Secondary | ICD-10-CM | POA: Diagnosis not present

## 2015-08-07 DIAGNOSIS — M9902 Segmental and somatic dysfunction of thoracic region: Secondary | ICD-10-CM | POA: Diagnosis not present

## 2015-08-07 DIAGNOSIS — M5134 Other intervertebral disc degeneration, thoracic region: Secondary | ICD-10-CM | POA: Diagnosis not present

## 2015-08-15 DIAGNOSIS — J301 Allergic rhinitis due to pollen: Secondary | ICD-10-CM | POA: Diagnosis not present

## 2015-08-21 DIAGNOSIS — M62830 Muscle spasm of back: Secondary | ICD-10-CM | POA: Diagnosis not present

## 2015-08-21 DIAGNOSIS — M5134 Other intervertebral disc degeneration, thoracic region: Secondary | ICD-10-CM | POA: Diagnosis not present

## 2015-08-21 DIAGNOSIS — M9902 Segmental and somatic dysfunction of thoracic region: Secondary | ICD-10-CM | POA: Diagnosis not present

## 2015-08-21 DIAGNOSIS — M9901 Segmental and somatic dysfunction of cervical region: Secondary | ICD-10-CM | POA: Diagnosis not present

## 2015-09-11 DIAGNOSIS — M9901 Segmental and somatic dysfunction of cervical region: Secondary | ICD-10-CM | POA: Diagnosis not present

## 2015-09-11 DIAGNOSIS — M62830 Muscle spasm of back: Secondary | ICD-10-CM | POA: Diagnosis not present

## 2015-09-11 DIAGNOSIS — M5134 Other intervertebral disc degeneration, thoracic region: Secondary | ICD-10-CM | POA: Diagnosis not present

## 2015-09-11 DIAGNOSIS — M9902 Segmental and somatic dysfunction of thoracic region: Secondary | ICD-10-CM | POA: Diagnosis not present

## 2015-10-02 DIAGNOSIS — M9901 Segmental and somatic dysfunction of cervical region: Secondary | ICD-10-CM | POA: Diagnosis not present

## 2015-10-02 DIAGNOSIS — M9902 Segmental and somatic dysfunction of thoracic region: Secondary | ICD-10-CM | POA: Diagnosis not present

## 2015-10-02 DIAGNOSIS — M62830 Muscle spasm of back: Secondary | ICD-10-CM | POA: Diagnosis not present

## 2015-10-02 DIAGNOSIS — M5134 Other intervertebral disc degeneration, thoracic region: Secondary | ICD-10-CM | POA: Diagnosis not present

## 2015-11-06 DIAGNOSIS — M62830 Muscle spasm of back: Secondary | ICD-10-CM | POA: Diagnosis not present

## 2015-11-06 DIAGNOSIS — M5134 Other intervertebral disc degeneration, thoracic region: Secondary | ICD-10-CM | POA: Diagnosis not present

## 2015-11-06 DIAGNOSIS — M9901 Segmental and somatic dysfunction of cervical region: Secondary | ICD-10-CM | POA: Diagnosis not present

## 2015-11-06 DIAGNOSIS — M9902 Segmental and somatic dysfunction of thoracic region: Secondary | ICD-10-CM | POA: Diagnosis not present

## 2015-12-04 DIAGNOSIS — M62830 Muscle spasm of back: Secondary | ICD-10-CM | POA: Diagnosis not present

## 2015-12-04 DIAGNOSIS — M9901 Segmental and somatic dysfunction of cervical region: Secondary | ICD-10-CM | POA: Diagnosis not present

## 2015-12-04 DIAGNOSIS — M5134 Other intervertebral disc degeneration, thoracic region: Secondary | ICD-10-CM | POA: Diagnosis not present

## 2015-12-04 DIAGNOSIS — M9902 Segmental and somatic dysfunction of thoracic region: Secondary | ICD-10-CM | POA: Diagnosis not present

## 2015-12-25 ENCOUNTER — Other Ambulatory Visit: Payer: Self-pay | Admitting: Internal Medicine

## 2015-12-25 DIAGNOSIS — M9902 Segmental and somatic dysfunction of thoracic region: Secondary | ICD-10-CM | POA: Diagnosis not present

## 2015-12-25 DIAGNOSIS — Z1231 Encounter for screening mammogram for malignant neoplasm of breast: Secondary | ICD-10-CM

## 2015-12-25 DIAGNOSIS — M5134 Other intervertebral disc degeneration, thoracic region: Secondary | ICD-10-CM | POA: Diagnosis not present

## 2015-12-25 DIAGNOSIS — M62830 Muscle spasm of back: Secondary | ICD-10-CM | POA: Diagnosis not present

## 2015-12-25 DIAGNOSIS — M9901 Segmental and somatic dysfunction of cervical region: Secondary | ICD-10-CM | POA: Diagnosis not present

## 2015-12-26 ENCOUNTER — Encounter: Payer: Self-pay | Admitting: Internal Medicine

## 2016-01-17 ENCOUNTER — Ambulatory Visit: Payer: Medicare Other

## 2016-01-17 DIAGNOSIS — Z23 Encounter for immunization: Secondary | ICD-10-CM | POA: Diagnosis not present

## 2016-01-22 DIAGNOSIS — M9902 Segmental and somatic dysfunction of thoracic region: Secondary | ICD-10-CM | POA: Diagnosis not present

## 2016-01-22 DIAGNOSIS — M62830 Muscle spasm of back: Secondary | ICD-10-CM | POA: Diagnosis not present

## 2016-01-22 DIAGNOSIS — M5134 Other intervertebral disc degeneration, thoracic region: Secondary | ICD-10-CM | POA: Diagnosis not present

## 2016-01-22 DIAGNOSIS — M9901 Segmental and somatic dysfunction of cervical region: Secondary | ICD-10-CM | POA: Diagnosis not present

## 2016-01-29 DIAGNOSIS — B353 Tinea pedis: Secondary | ICD-10-CM | POA: Diagnosis not present

## 2016-01-29 DIAGNOSIS — L570 Actinic keratosis: Secondary | ICD-10-CM | POA: Diagnosis not present

## 2016-01-30 ENCOUNTER — Encounter: Payer: Self-pay | Admitting: Family

## 2016-01-30 ENCOUNTER — Ambulatory Visit (INDEPENDENT_AMBULATORY_CARE_PROVIDER_SITE_OTHER): Payer: Medicare Other | Admitting: Family

## 2016-01-30 ENCOUNTER — Encounter: Payer: Self-pay | Admitting: Internal Medicine

## 2016-01-30 VITALS — BP 118/80 | HR 80 | Temp 97.6°F | Ht 66.0 in | Wt 171.4 lb

## 2016-01-30 DIAGNOSIS — R0981 Nasal congestion: Secondary | ICD-10-CM

## 2016-01-30 MED ORDER — PROMETHAZINE-DM 6.25-15 MG/5ML PO SYRP: 5.0000 mL | ORAL_SOLUTION | Freq: Every evening | ORAL | 1 refills | Status: DC | PRN

## 2016-01-30 MED ORDER — AMOXICILLIN 500 MG PO TABS: ORAL_TABLET | ORAL | 0 refills | Status: DC

## 2016-01-30 NOTE — Progress Notes (Signed)
Subjective:    Patient ID: Christina Hammond, female    DOB: 1950/10/18, 65 y.o.   MRN: [id number]  CC: CHEE SULEK is a 65 y.o. female who presents today for an acute visit.    HPI: Patient here for acute visit with chief complaint of sore throat and thick nasal congestion which started yesterday. Occasional cough. No wheezing, SOB, fever, chills. Tried benadryl and tyleonol which helped some. Leaving for trip for 8 years.   She has a history of seasonal allergies.    HISTORY:  Past Medical History:  Diagnosis Date  . Cancer ([place])    basal cell,  [facility]   . Restless legs syndrome (RLS)    managed with minimal meds   Past Surgical History:  Procedure Laterality Date  . ABDOMINAL HYSTERECTOMY  1979  . APPENDECTOMY  1956  . CESAREAN SECTION    . CHOLECYSTECTOMY     Family History  Problem Relation Age of Onset  . Diabetes Mother   . Stroke Mother   . Cancer Father   . Heart disease Father   . Cancer Maternal Aunt   . Mental retardation Maternal Aunt   . Alcohol abuse Maternal Uncle     Allergies: Diclofenac Current Outpatient Prescriptions on File Prior to Visit  Medication Sig Dispense Refill  . calcium carbonate (OS-CAL) 600 MG TABS Take 600 mg by mouth 2 (two) times daily with a meal.    . mometasone (NASONEX) 50 MCG/ACT nasal spray Place 2 sprays into the nose daily. 17 g 12  . triamcinolone cream (KENALOG) 0.1 % Apply topically 2 (two) times daily. 30 g 0  . zolpidem (AMBIEN) 10 MG tablet Take 1 tablet (10 mg total) by mouth at bedtime as needed for sleep. 90 tablet 1   No current facility-administered medications on file prior to visit.     Social History  Substance Use Topics  . Smoking status: Never Smoker  . Smokeless tobacco: Never Used  . Alcohol use Yes    Review of Systems  Constitutional: Negative for chills and fever.  HENT: Positive for congestion and rhinorrhea. Negative for sinus pain and sinus pressure.   Respiratory: Positive for cough.  Negative for shortness of breath and wheezing.   Cardiovascular: Negative for chest pain and palpitations.  Gastrointestinal: Negative for nausea and vomiting.      Objective:    BP 118/80   Pulse 80   Temp 97.6 F (36.4 C) (Oral)   Ht 5\' 6"  (1.676 m)   Wt 171 lb 6.4 oz (77.7 kg)   SpO2 97%   BMI 27.66 kg/m    Physical Exam  Constitutional: She appears well-developed and well-nourished.  HENT:  Head: Normocephalic and atraumatic.  Right Ear: Hearing, tympanic membrane, external ear and ear canal normal. No drainage, swelling or tenderness. No foreign bodies. Tympanic membrane is not erythematous and not bulging. No middle ear effusion. No decreased hearing is noted.  Left Ear: Hearing, tympanic membrane, external ear and ear canal normal. No drainage, swelling or tenderness. No foreign bodies. Tympanic membrane is not erythematous and not bulging.  No middle ear effusion. No decreased hearing is noted.  Nose: Rhinorrhea present. Right sinus exhibits no maxillary sinus tenderness and no frontal sinus tenderness. Left sinus exhibits no maxillary sinus tenderness and no frontal sinus tenderness.  Mouth/Throat: Uvula is midline, oropharynx is clear and moist and mucous membranes are normal. No oropharyngeal exudate, posterior oropharyngeal edema, posterior oropharyngeal erythema or tonsillar abscesses.  Eyes:  Conjunctivae are normal.  Cardiovascular: Regular rhythm, normal heart sounds and normal pulses.   Pulmonary/Chest: Effort normal and breath sounds normal. She has no wheezes. She has no rhonchi. She has no rales.  Lymphadenopathy:       Head (right side): No submental, no submandibular, no tonsillar, no preauricular, no posterior auricular and no occipital adenopathy present.       Head (left side): No submental, no submandibular, no tonsillar, no preauricular, no posterior auricular and no occipital adenopathy present.    She has no cervical adenopathy.  Neurological: She is  alert.  Skin: Skin is warm and dry.  Psychiatric: She has a normal mood and affect. Her speech is normal and behavior is normal. Thought content normal.  Vitals reviewed.      Assessment & Plan:   1. Sinus congestion Suspect viral etiology. Patient and I jointly agreed that she would delay filling antibiotic and treat conservatively the next couple days. Return precautions given.  - promethazine-dextromethorphan (PROMETHAZINE-DM) 6.25-15 MG/5ML syrup; Take 5 mLs by mouth at bedtime as needed for cough.  Dispense: 40 mL; Refill: 1 - amoxicillin (AMOXIL) 500 MG tablet; Take two tablets ( total 1000 mg) PO q24h for 10 days  Dispense: 20 tablet; Refill: 0    I have discontinued Ms. Emmick's fexofenadine, amoxicillin-clavulanate, and predniSONE. I am also having her start on promethazine-dextromethorphan and amoxicillin. Additionally, I am having her maintain her calcium carbonate, triamcinolone cream, mometasone, and zolpidem.   Meds ordered this encounter  Medications  . promethazine-dextromethorphan (PROMETHAZINE-DM) 6.25-15 MG/5ML syrup    Sig: Take 5 mLs by mouth at bedtime as needed for cough.    Dispense:  40 mL    Refill:  1    Order Specific Question:   Supervising Provider    Answer:   Derrel Nip, TERESA L [[id number]]  . amoxicillin (AMOXIL) 500 MG tablet    Sig: Take two tablets ( total 1000 mg) PO q24h for 10 days    Dispense:  20 tablet    Refill:  0    Order Specific Question:   Supervising Provider    Answer:   Crecencio Mc [[id number]]    Return precautions given.   Risks, benefits, and alternatives of the medications and treatment plan prescribed today were discussed, and patient expressed understanding.   Education regarding symptom management and diagnosis given to patient on AVS.  Continue to follow with TULLO, Aris Everts, MD for routine health maintenance.   Christina Hammond and I agreed with plan.   Mable Paris, FNP

## 2016-01-30 NOTE — Progress Notes (Signed)
Pre visit review using our clinic review tool, if applicable. No additional management support is needed unless otherwise documented below in the visit note. 

## 2016-01-30 NOTE — Patient Instructions (Signed)
I suspect that your infection is viral in nature.  As discussed, I advise that you wait to fill the antibiotic after 1-2 days of symptom management to see if your symptoms improve. If you do not show improvement, you may take the antibiotic as prescribed.  Increase intake of clear fluids. Congestion is best treated by hydration, when mucus is wetter, it is thinner, less sticky, and easier to expel from the body, either through coughing up drainage, or by blowing your nose.   Get plenty of rest.   Use saline nasal drops and blow your nose frequently. Run a humidifier at night and elevate the head of the bed. Vicks Vapor rub will help with congestion and cough. Steam showers and sinus massage for congestion.   Use Acetaminophen or Ibuprofen as needed for fever or pain. Avoid second hand smoke. Even the smallest exposure will worsen symptoms.   Over the counter medications you can try include Delsym for cough, a decongestant for congestion, and Mucinex or Robitussin as an expectorant. Be sure to just get the plain Mucinex or Robitussin that just has one medication (Guaifenesen). We don't recommend the combination products. Note, be sure to drink two glasses of water with each dose of Mucinex as the medication will not work well without adequate hydration.   You can also try a teaspoon of honey to see if this will help reduce cough. Throat lozenges can sometimes be beneficial as well.    This illness will typically last 7 - 10 days.   Please follow up with our clinic if you develop a fever greater than 101 F, symptoms worsen, or do not resolve in the next week.     

## 2016-01-30 NOTE — Telephone Encounter (Signed)
Patient scheduled with [facility] for today.

## 2016-02-16 ENCOUNTER — Ambulatory Visit
Admission: RE | Admit: 2016-02-16 | Discharge: 2016-02-16 | Disposition: A | Discharge status: Home / Self Care | Payer: Medicare Other | Source: Ambulatory Visit | Attending: Internal Medicine | Admitting: Internal Medicine

## 2016-02-16 DIAGNOSIS — Z1231 Encounter for screening mammogram for malignant neoplasm of breast: Secondary | ICD-10-CM | POA: Diagnosis not present

## 2016-02-19 DIAGNOSIS — M5134 Other intervertebral disc degeneration, thoracic region: Secondary | ICD-10-CM | POA: Diagnosis not present

## 2016-02-19 DIAGNOSIS — M9901 Segmental and somatic dysfunction of cervical region: Secondary | ICD-10-CM | POA: Diagnosis not present

## 2016-02-19 DIAGNOSIS — M62830 Muscle spasm of back: Secondary | ICD-10-CM | POA: Diagnosis not present

## 2016-02-19 DIAGNOSIS — M9902 Segmental and somatic dysfunction of thoracic region: Secondary | ICD-10-CM | POA: Diagnosis not present

## 2016-02-26 ENCOUNTER — Encounter: Payer: Self-pay | Admitting: Internal Medicine

## 2016-02-26 ENCOUNTER — Telehealth: Payer: Self-pay | Admitting: Internal Medicine

## 2016-02-26 ENCOUNTER — Ambulatory Visit (INDEPENDENT_AMBULATORY_CARE_PROVIDER_SITE_OTHER): Payer: Medicare Other | Admitting: Internal Medicine

## 2016-02-26 VITALS — BP 118/74 | HR 85 | Temp 97.5°F | Resp 12 | Ht 66.0 in | Wt 168.2 lb

## 2016-02-26 DIAGNOSIS — Z0000 Encounter for general adult medical examination without abnormal findings: Secondary | ICD-10-CM

## 2016-02-26 DIAGNOSIS — R5383 Other fatigue: Secondary | ICD-10-CM

## 2016-02-26 DIAGNOSIS — E559 Vitamin D deficiency, unspecified: Secondary | ICD-10-CM

## 2016-02-26 DIAGNOSIS — G4733 Obstructive sleep apnea (adult) (pediatric): Secondary | ICD-10-CM | POA: Diagnosis not present

## 2016-02-26 DIAGNOSIS — Z1211 Encounter for screening for malignant neoplasm of colon: Secondary | ICD-10-CM

## 2016-02-26 DIAGNOSIS — E7800 Pure hypercholesterolemia, unspecified: Secondary | ICD-10-CM

## 2016-02-26 DIAGNOSIS — J301 Allergic rhinitis due to pollen: Secondary | ICD-10-CM

## 2016-02-26 DIAGNOSIS — N952 Postmenopausal atrophic vaginitis: Secondary | ICD-10-CM

## 2016-02-26 MED ORDER — ESTRADIOL 0.1 MG/GM VA CREA: 1.0000 | TOPICAL_CREAM | Freq: Every day | VAGINAL | 12 refills | Status: DC

## 2016-02-26 MED ORDER — ESTROGENS, CONJUGATED 0.625 MG/GM VA CREA: 1.0000 | TOPICAL_CREAM | Freq: Every day | VAGINAL | 12 refills | Status: DC

## 2016-02-26 NOTE — Progress Notes (Signed)
Pre-visit discussion using our clinic review tool. No additional management support is needed unless otherwise documented below in the visit note.  

## 2016-02-26 NOTE — Telephone Encounter (Signed)
Pharmacy called and stated that conjugated estrogens (PREMARIN) vaginal cream is not covered by the insurance but estrace vaginal cream is covered. Please advise, thank you!  [place], [place]

## 2016-02-26 NOTE — Patient Instructions (Addendum)
YOU DO NOT NEED TO HAVE ANY MORE PREVNAR VACCINE (you need the PNEUMOVAX VACCINE  nEXT YEAR )   PREVNAR VS PNEUMOVAX  YOU NEED PNEUMOVAX   I have prescribed Premarin (vaginal estrogen) to manage your dryness.  Use it nightly for 2 weeks,  Then reduce use to 2/week    Health Maintenance for Postmenopausal Women Introduction Menopause is a normal process in which your reproductive ability comes to an end. This process happens gradually over a span of months to years, usually between the ages of 49 and 36. Menopause is complete when you have missed 12 consecutive menstrual periods. It is important to talk with your health care provider about some of the most common conditions that affect postmenopausal women, such as heart disease, cancer, and bone loss (osteoporosis). Adopting a healthy lifestyle and getting preventive care can help to promote your health and wellness. Those actions can also lower your chances of developing some of these common conditions. What should I know about menopause? During menopause, you may experience a number of symptoms, such as:  Moderate-to-severe hot flashes.  Night sweats.  Decrease in sex drive.  Mood swings.  Headaches.  Tiredness.  Irritability.  Memory problems.  Insomnia. Choosing to treat or not to treat menopausal changes is an individual decision that you make with your health care provider. What should I know about hormone replacement therapy and supplements? Hormone therapy products are effective for treating symptoms that are associated with menopause, such as hot flashes and night sweats. Hormone replacement carries certain risks, especially as you become older. If you are thinking about using estrogen or estrogen with progestin treatments, discuss the benefits and risks with your health care provider. What should I know about heart disease and stroke? Heart disease, heart attack, and stroke become more likely as you age. This may be  due, in part, to the hormonal changes that your body experiences during menopause. These can affect how your body processes dietary fats, triglycerides, and cholesterol. Heart attack and stroke are both medical emergencies. There are many things that you can do to help prevent heart disease and stroke:  Have your blood pressure checked at least every 1-2 years. High blood pressure causes heart disease and increases the risk of stroke.  If you are 73-26 years old, ask your health care provider if you should take aspirin to prevent a heart attack or a stroke.  Do not use any tobacco products, including cigarettes, chewing tobacco, or electronic cigarettes. If you need help quitting, ask your health care provider.  It is important to eat a healthy diet and maintain a healthy weight.  Be sure to include plenty of vegetables, fruits, low-fat dairy products, and lean protein.  Avoid eating foods that are high in solid fats, added sugars, or salt (sodium).  Get regular exercise. This is one of the most important things that you can do for your health.  Try to exercise for at least 150 minutes each week. The type of exercise that you do should increase your heart rate and make you sweat. This is known as moderate-intensity exercise.  Try to do strengthening exercises at least twice each week. Do these in addition to the moderate-intensity exercise.  Know your numbers.Ask your health care provider to check your cholesterol and your blood glucose. Continue to have your blood tested as directed by your health care provider. What should I know about cancer screening? There are several types of cancer. Take the following steps to reduce  your risk and to catch any cancer development as early as possible. Breast Cancer  Practice breast self-awareness.  This means understanding how your breasts normally appear and feel.  It also means doing regular breast self-exams. Let your health care provider know  about any changes, no matter how small.  If you are 95 or older, have a clinician do a breast exam (clinical breast exam or CBE) every year. Depending on your age, family history, and medical history, it may be recommended that you also have a yearly breast X-ray (mammogram).  If you have a family history of breast cancer, talk with your health care provider about genetic screening.  If you are at high risk for breast cancer, talk with your health care provider about having an MRI and a mammogram every year.  Breast cancer (BRCA) gene test is recommended for women who have family members with BRCA-related cancers. Results of the assessment will determine the need for genetic counseling and BRCA1 and for BRCA2 testing. BRCA-related cancers include these types:  Breast. This occurs in males or females.  Ovarian.  Tubal. This may also be called fallopian tube cancer.  Cancer of the abdominal or pelvic lining (peritoneal cancer).  Prostate.  Pancreatic. Cervical, Uterine, and Ovarian Cancer  Your health care provider may recommend that you be screened regularly for cancer of the pelvic organs. These include your ovaries, uterus, and vagina. This screening involves a pelvic exam, which includes checking for microscopic changes to the surface of your cervix (Pap test).  For women ages 21-65, health care providers may recommend a pelvic exam and a Pap test every three years. For women ages 75-65, they may recommend the Pap test and pelvic exam, combined with testing for human papilloma virus (HPV), every five years. Some types of HPV increase your risk of cervical cancer. Testing for HPV may also be done on women of any age who have unclear Pap test results.  Other health care providers may not recommend any screening for nonpregnant women who are considered low risk for pelvic cancer and have no symptoms. Ask your health care provider if a screening pelvic exam is right for you.  If you have  had past treatment for cervical cancer or a condition that could lead to cancer, you need Pap tests and screening for cancer for at least 20 years after your treatment. If Pap tests have been discontinued for you, your risk factors (such as having a new sexual partner) need to be reassessed to determine if you should start having screenings again. Some women have medical problems that increase the chance of getting cervical cancer. In these cases, your health care provider may recommend that you have screening and Pap tests more often.  If you have a family history of uterine cancer or ovarian cancer, talk with your health care provider about genetic screening.  If you have vaginal bleeding after reaching menopause, tell your health care provider.  There are currently no reliable tests available to screen for ovarian cancer. Lung Cancer  Lung cancer screening is recommended for adults 2-61 years old who are at high risk for lung cancer because of a history of smoking. A yearly low-dose CT scan of the lungs is recommended if you:  Currently smoke.  Have a history of at least 30 pack-years of smoking and you currently smoke or have quit within the past 15 years. A pack-year is smoking an average of one pack of cigarettes per day for one year. Yearly  screening should:  Continue until it has been 15 years since you quit.  Stop if you develop a health problem that would prevent you from having lung cancer treatment. Colorectal Cancer  This type of cancer can be detected and can often be prevented.  Routine colorectal cancer screening usually begins at age 62 and continues through age 63.  If you have risk factors for colon cancer, your health care provider may recommend that you be screened at an earlier age.  If you have a family history of colorectal cancer, talk with your health care provider about genetic screening.  Your health care provider may also recommend using home test kits to  check for hidden blood in your stool.  A small camera at the end of a tube can be used to examine your colon directly (sigmoidoscopy or colonoscopy). This is done to check for the earliest forms of colorectal cancer.  Direct examination of the colon should be repeated every 5-10 years until age 52. However, if early forms of precancerous polyps or small growths are found or if you have a family history or genetic risk for colorectal cancer, you may need to be screened more often. Skin Cancer  Check your skin from head to toe regularly.  Monitor any moles. Be sure to tell your health care provider:  About any new moles or changes in moles, especially if there is a change in a mole's shape or color.  If you have a mole that is larger than the size of a pencil eraser.  If any of your family members has a history of skin cancer, especially at a young age, talk with your health care provider about genetic screening.  Always use sunscreen. Apply sunscreen liberally and repeatedly throughout the day.  Whenever you are outside, protect yourself by wearing long sleeves, pants, a wide-brimmed hat, and sunglasses. What should I know about osteoporosis? Osteoporosis is a condition in which bone destruction happens more quickly than new bone creation. After menopause, you may be at an increased risk for osteoporosis. To help prevent osteoporosis or the bone fractures that can happen because of osteoporosis, the following is recommended:  If you are 72-8 years old, get at least 1,000 mg of calcium and at least 600 mg of vitamin D per day.  If you are older than age 33 but younger than age 2, get at least 1,200 mg of calcium and at least 600 mg of vitamin D per day.  If you are older than age 34, get at least 1,200 mg of calcium and at least 800 mg of vitamin D per day. Smoking and excessive alcohol intake increase the risk of osteoporosis. Eat foods that are rich in calcium and vitamin D, and do  weight-bearing exercises several times each week as directed by your health care provider. What should I know about how menopause affects my mental health? Depression may occur at any age, but it is more common as you become older. Common symptoms of depression include:  Low or sad mood.  Changes in sleep patterns.  Changes in appetite or eating patterns.  Feeling an overall lack of motivation or enjoyment of activities that you previously enjoyed.  Frequent crying spells. Talk with your health care provider if you think that you are experiencing depression. What should I know about immunizations? It is important that you get and maintain your immunizations. These include:  Tetanus, diphtheria, and pertussis (Tdap) booster vaccine.  Influenza every year before the flu season  begins.  Pneumonia vaccine.  Shingles vaccine. Your health care provider may also recommend other immunizations. This information is not intended to replace advice given to you by your health care provider. Make sure you discuss any questions you have with your health care provider. Document Released: 04/26/2005 Document Revised: 09/22/2015 Document Reviewed: 12/06/2014  2017 Elsevier

## 2016-02-26 NOTE — Progress Notes (Signed)
Patient ID: Christina Hammond, female    DOB: [birth year]  Age: 65 y.o. MRN: [id number]  The patient is here for annual  wellness examination and management of other chronic and acute problems.   [facility]  [facility] at [facility] along with a prevnar  vaccine Colonoscopy normal 2008 , due 2018 Mammogram normal Feb 16 2016 Tdap given 2013 DeXA done in 2011   The risk factors are reflected in the social history.  The roster of all physicians providing medical care to patient - is listed in the Snapshot section of the chart.  Activities of daily living:  The patient is 100% independent in all ADLs: dressing, toileting, feeding as well as independent mobility  Home safety : The patient has smoke detectors in the home. They wear seatbelts.  There are no firearms at home. There is no violence in the home.   There is no risks for hepatitis, STDs or HIV. There is no   history of blood transfusion. They have no travel history to infectious disease endemic areas of the world.  The patient has seen their dentist in the last six month. They have seen their eye doctor in the last year. They admit to slight hearing difficulty with regard to whispered voices and some television programs.  They have deferred audiologic testing in the last year.  They do not  have excessive sun exposure. Discussed the need for sun protection: hats, long sleeves and use of sunscreen if there is significant sun exposure.   Diet: the importance of a healthy diet is discussed. They do have a healthy diet.  The benefits of regular aerobic exercise were discussed. She walks 4 times per week ,  20 minutes.   Depression screen: there are no signs or vegative symptoms of depression- irritability, change in appetite, anhedonia, sadness/tearfullness.  Cognitive assessment: the patient manages all their financial and personal affairs and is actively engaged. They could relate day,date,year and events; recalled 3/3 objects at 3 minutes;  performed clock-face test normally.  The following portions of the patient's history were reviewed and updated as appropriate: allergies, current medications, past family history, past medical history,  past surgical history, past social history  and problem list.  Visual acuity was not assessed per patient preference since she has regular follow up with her ophthalmologist. Hearing and body mass index were assessed and reviewed.   During the course of the visit the patient was educated and counseled about appropriate screening and preventive services including : fall prevention , diabetes screening, nutrition counseling, colorectal cancer screening, and recommended immunizations.    CC: The primary encounter diagnosis was Fatigue, unspecified type. Diagnoses of Vitamin D deficiency, Pure hypercholesterolemia, OSA (obstructive sleep apnea), Chronic seasonal allergic rhinitis due to pollen, Postmenopausal atrophic vaginitis, Special screening for malignant neoplasms, colon, and Encounter for preventive health examination were also pertinent to this visit.    Diagnosed with OSA Oct 2016. AHI 33 .  Prescribed  CPAP 6 cm H2o but due to claustophobia  Was intolerant of mask,  Was sent to pumonology  .  Ha sno daytime fatigue,  No hypertension or edema.    Allergic conjunctivitis. Using patanol  Year round  prescribed by optometrist for allergic conjunctivitis.    Using zyrtec for seasonal allergic rhinitis   Using a Wi Fit for  for exercise   No libido,  Vaginal dryness,  husband very loving and supportive. Notes vaginal stenosis  History [name] has a past medical history of Cancer (  HCC) and Restless legs syndrome (RLS).   She has a past surgical history that includes Abdominal hysterectomy (1979); Cholecystectomy; Appendectomy (1956); and Cesarean section.   Her family history includes Alcohol abuse in her maternal uncle; Cancer in her father and maternal aunt; Diabetes in her mother; Heart  disease in her father; Mental retardation in her maternal aunt; Stroke in her mother.She reports that she has never smoked. She has never used smokeless tobacco. She reports that she drinks alcohol. She reports that she does not use drugs.  Outpatient Medications Prior to Visit  Medication Sig Dispense Refill  . mometasone (NASONEX) 50 MCG/ACT nasal spray Place 2 sprays into the nose daily. 17 g 12  . zolpidem (AMBIEN) 10 MG tablet Take 1 tablet (10 mg total) by mouth at bedtime as needed for sleep. 90 tablet 1  . calcium carbonate (OS-CAL) 600 MG TABS Take 600 mg by mouth 2 (two) times daily with a meal.    . amoxicillin (AMOXIL) 500 MG tablet Take two tablets ( total 1000 mg) PO q24h for 10 days 20 tablet 0  . promethazine-dextromethorphan (PROMETHAZINE-DM) 6.25-15 MG/5ML syrup Take 5 mLs by mouth at bedtime as needed for cough. (Patient not taking: Reported on 02/26/2016) 40 mL 1  . triamcinolone cream (KENALOG) 0.1 % Apply topically 2 (two) times daily. (Patient not taking: Reported on 02/26/2016) 30 g 0   No facility-administered medications prior to visit.     Review of Systems   Patient denies headache, fevers, malaise, unintentional weight loss, skin rash, eye pain, sinus congestion and sinus pain, sore throat, dysphagia,  hemoptysis , cough, dyspnea, wheezing, chest pain, palpitations, orthopnea, edema, abdominal pain, nausea, melena, diarrhea, constipation, flank pain, dysuria, hematuria, urinary  Frequency, nocturia, numbness, tingling, seizures,  Focal weakness, Loss of consciousness,  Tremor, insomnia, depression, anxiety, and suicidal ideation.     Objective:  BP 118/74   Pulse 85   Temp 97.5 F (36.4 C) (Oral)   Resp 12   Ht 5\' 6"  (1.676 m)   Wt 168 lb 4 oz (76.3 kg)   SpO2 99%   BMI 27.16 kg/m   Physical Exam   General appearance: alert, cooperative and appears stated age Head: Normocephalic, without obvious abnormality, atraumatic Eyes: conjunctivae/corneas clear.  PERRL, EOM's intact. Fundi benign. Ears: normal TM's and external ear canals both ears Nose: Nares normal. Septum midline. Mucosa normal. No drainage or sinus tenderness. Throat: lips, mucosa, and tongue normal; teeth and gums normal Neck: no adenopathy, no carotid bruit, no JVD, supple, symmetrical, trachea midline and thyroid not enlarged, symmetric, no tenderness/mass/nodules Lungs: clear to auscultation bilaterally Breasts: normal appearance, no masses or tenderness Heart: regular rate and rhythm, S1, S2 normal, no murmur, click, rub or gallop Abdomen: soft, non-tender; bowel sounds normal; no masses,  no organomegaly Extremities: extremities normal, atraumatic, no cyanosis or edema Pulses: 2+ and symmetric Skin: Skin color, texture, turgor normal. No rashes or lesions Neurologic: Alert and oriented X 3, normal strength and tone. Normal symmetric reflexes. Normal coordination and gait.      Assessment & Plan:   Problem List Items Addressed This Visit    Allergic rhinitis    Seasonal, managed with antihistamine.       Encounter for preventive health examination    Annual comprehensive preventive exam was done as well as an evaluation and management of chronic conditions .  During the course of the visit the patient was educated and counseled about appropriate screening and preventive services including :  diabetes screening, lipid analysis with projected  10 year  risk for CAD , nutrition counseling, breast, cervical and colorectal cancer screening, and recommended immunizations.  Printed recommendations for health maintenance screenings was given.she will return for fasting labs  Lab Results  Component Value Date   CHOL 201 (H) 09/01/2014   HDL 51.60 09/01/2014   LDLCALC 132 (H) 09/01/2014   TRIG 87.0 09/01/2014   CHOLHDL 4 09/01/2014         Fatigue - Primary   Relevant Orders   Comprehensive metabolic panel   TSH   CBC with Differential/Platelet   OSA (obstructive  sleep apnea)    Intolerant of mask,  So she prefers to not treat. Has seen pulmonology.  I again reminded her of the long term complications of untreated OSA.  She has no symptoms  or signs of OSA on exam.       Postmenopausal atrophic vaginitis    Symptomatic with dyspareunia.  Patient advised and instructed on use of estrace vaginal cream       Special screening for malignant neoplasms, colon    She will be due in 2018 for colonoscopy       Other Visit Diagnoses    Vitamin D deficiency       Relevant Orders   VITAMIN D 25 Hydroxy (Vit-D Deficiency, Fractures)   Pure hypercholesterolemia       Relevant Orders   Lipid panel     A total of 25 minutes of face to face time was spent with patient more than half of which was spent in counselling about the above mentioned conditions  and coordination of care  I have discontinued Ms. Loudon's triamcinolone cream, promethazine-dextromethorphan, and amoxicillin. I am also having her maintain her calcium carbonate, mometasone, and zolpidem.  Meds ordered this encounter  Medications  . DISCONTD: conjugated estrogens (PREMARIN) vaginal cream    Sig: Place 1 Applicatorful vaginally daily. For 2 weeks,  Then reduce use to twice weekly    Dispense:  42.5 g    Refill:  12    Medications Discontinued During This Encounter  Medication Reason  . amoxicillin (AMOXIL) 500 MG tablet Completed Course  . promethazine-dextromethorphan (PROMETHAZINE-DM) 6.25-15 MG/5ML syrup   . triamcinolone cream (KENALOG) 0.1 %     Follow-up: No Follow-up on file.   Crecencio Mc, MD

## 2016-02-26 NOTE — Telephone Encounter (Signed)
Alternative sent

## 2016-02-27 LAB — COMPREHENSIVE METABOLIC PANEL
ALK PHOS: 53 U/L (ref 39–117)
ALT: 17 U/L (ref 0–35)
AST: 15 U/L (ref 0–37)
Albumin: 4.3 g/dL (ref 3.5–5.2)
BUN: 14 mg/dL (ref 6–23)
CO2: 30 meq/L (ref 19–32)
Calcium: 9.2 mg/dL (ref 8.4–10.5)
Chloride: 103 mEq/L (ref 96–112)
Creatinine, Ser: 0.63 mg/dL (ref 0.40–1.20)
GFR: 99.93 mL/min (ref >60.00)
GLUCOSE: 104 mg/dL — AB (ref 70–99)
POTASSIUM: 4.1 meq/L (ref 3.5–5.1)
SODIUM: 141 meq/L (ref 135–145)
TOTAL PROTEIN: 6.7 g/dL (ref 6.0–8.3)
Total Bilirubin: 0.4 mg/dL (ref 0.2–1.2)

## 2016-02-27 LAB — CBC WITH DIFFERENTIAL/PLATELET
BASOS ABS: 0 10*3/uL (ref 0.0–0.1)
BASOS PCT: 0.4 % (ref 0.0–3.0)
EOS ABS: 0.1 10*3/uL (ref 0.0–0.7)
Eosinophils Relative: 1.7 % (ref 0.0–5.0)
HCT: 40.4 % (ref 36.0–46.0)
Hemoglobin: 13.7 g/dL (ref 12.0–15.0)
LYMPHS ABS: 2 10*3/uL (ref 0.7–4.0)
LYMPHS PCT: 36 % (ref 12.0–46.0)
MCHC: 33.8 g/dL (ref 30.0–36.0)
MCV: 88.5 fl (ref 78.0–100.0)
MONO ABS: 0.4 10*3/uL (ref 0.1–1.0)
Monocytes Relative: 7.3 % (ref 3.0–12.0)
NEUTROS ABS: 3 10*3/uL (ref 1.4–7.7)
NEUTROS PCT: 54.6 % (ref 43.0–77.0)
PLATELETS: 302 10*3/uL (ref 150.0–400.0)
RBC: 4.57 Mil/uL (ref 3.87–5.11)
RDW: 13.1 % (ref 11.5–15.5)
WBC: 5.4 10*3/uL (ref 4.0–10.5)

## 2016-02-27 LAB — LIPID PANEL
CHOL/HDL RATIO: 3
Cholesterol: 244 mg/dL — ABNORMAL HIGH (ref 0–200)
HDL: 72.6 mg/dL (ref >39.00)
LDL CALC: 155 mg/dL — AB (ref 0–99)
NONHDL: 171.45
Triglycerides: 80 mg/dL (ref 0.0–149.0)
VLDL: 16 mg/dL (ref 0.0–40.0)

## 2016-02-27 LAB — VITAMIN D 25 HYDROXY (VIT D DEFICIENCY, FRACTURES): VITD: 32.76 ng/mL (ref 30.00–100.00)

## 2016-02-27 LAB — TSH: TSH: 1.19 u[IU]/mL (ref 0.35–4.50)

## 2016-02-27 NOTE — Assessment & Plan Note (Signed)
She will be due in 2018 for colonoscopy

## 2016-02-27 NOTE — Assessment & Plan Note (Signed)
Seasonal, managed with antihistamine.

## 2016-02-27 NOTE — Assessment & Plan Note (Addendum)
Intolerant of mask,  So she prefers to not treat. Has seen pulmonology.  I again reminded her of the long term complications of untreated OSA.  She has no symptoms  or signs of OSA on exam.

## 2016-02-27 NOTE — Assessment & Plan Note (Signed)
Symptomatic with dyspareunia.  Patient advised and instructed on use of estrace vaginal cream

## 2016-02-27 NOTE — Telephone Encounter (Signed)
Patient notified

## 2016-02-27 NOTE — Assessment & Plan Note (Addendum)
Annual comprehensive preventive exam was done as well as an evaluation and management of chronic conditions .  During the course of the visit the patient was educated and counseled about appropriate screening and preventive services including :  diabetes screening, lipid analysis with projected  10 year  risk for CAD , nutrition counseling, breast, cervical and colorectal cancer screening, and recommended immunizations.  Printed recommendations for health maintenance screenings was given.she will return for fasting labs  Lab Results  Component Value Date   CHOL 201 (H) 09/01/2014   HDL 51.60 09/01/2014   LDLCALC 132 (H) 09/01/2014   TRIG 87.0 09/01/2014   CHOLHDL 4 09/01/2014

## 2016-02-29 ENCOUNTER — Encounter: Payer: Self-pay | Admitting: Internal Medicine

## 2016-03-04 ENCOUNTER — Encounter: Payer: Self-pay | Admitting: Internal Medicine

## 2016-03-04 ENCOUNTER — Other Ambulatory Visit: Payer: Self-pay | Admitting: Internal Medicine

## 2016-03-04 MED ORDER — ZOLPIDEM TARTRATE 10 MG PO TABS: 10.0000 mg | ORAL_TABLET | Freq: Every evening | ORAL | 3 refills | Status: DC | PRN

## 2016-03-05 NOTE — Telephone Encounter (Signed)
Rx Ambien 10 mg sent to [facility]

## 2016-03-20 DIAGNOSIS — R52 Pain, unspecified: Secondary | ICD-10-CM | POA: Diagnosis not present

## 2016-03-20 DIAGNOSIS — L570 Actinic keratosis: Secondary | ICD-10-CM | POA: Diagnosis not present

## 2016-03-25 DIAGNOSIS — M9902 Segmental and somatic dysfunction of thoracic region: Secondary | ICD-10-CM | POA: Diagnosis not present

## 2016-03-25 DIAGNOSIS — M9901 Segmental and somatic dysfunction of cervical region: Secondary | ICD-10-CM | POA: Diagnosis not present

## 2016-03-25 DIAGNOSIS — M62830 Muscle spasm of back: Secondary | ICD-10-CM | POA: Diagnosis not present

## 2016-03-25 DIAGNOSIS — M5134 Other intervertebral disc degeneration, thoracic region: Secondary | ICD-10-CM | POA: Diagnosis not present

## 2016-03-26 ENCOUNTER — Encounter: Payer: Self-pay | Admitting: Internal Medicine

## 2016-04-29 DIAGNOSIS — M9902 Segmental and somatic dysfunction of thoracic region: Secondary | ICD-10-CM | POA: Diagnosis not present

## 2016-04-29 DIAGNOSIS — M62830 Muscle spasm of back: Secondary | ICD-10-CM | POA: Diagnosis not present

## 2016-04-29 DIAGNOSIS — M5134 Other intervertebral disc degeneration, thoracic region: Secondary | ICD-10-CM | POA: Diagnosis not present

## 2016-04-29 DIAGNOSIS — M9901 Segmental and somatic dysfunction of cervical region: Secondary | ICD-10-CM | POA: Diagnosis not present

## 2016-05-06 DIAGNOSIS — L0292 Furuncle, unspecified: Secondary | ICD-10-CM | POA: Diagnosis not present

## 2016-05-28 DIAGNOSIS — M5134 Other intervertebral disc degeneration, thoracic region: Secondary | ICD-10-CM | POA: Diagnosis not present

## 2016-05-28 DIAGNOSIS — M9902 Segmental and somatic dysfunction of thoracic region: Secondary | ICD-10-CM | POA: Diagnosis not present

## 2016-05-28 DIAGNOSIS — M62830 Muscle spasm of back: Secondary | ICD-10-CM | POA: Diagnosis not present

## 2016-05-28 DIAGNOSIS — M9901 Segmental and somatic dysfunction of cervical region: Secondary | ICD-10-CM | POA: Diagnosis not present

## 2016-06-25 DIAGNOSIS — M9901 Segmental and somatic dysfunction of cervical region: Secondary | ICD-10-CM | POA: Diagnosis not present

## 2016-06-25 DIAGNOSIS — M62830 Muscle spasm of back: Secondary | ICD-10-CM | POA: Diagnosis not present

## 2016-06-25 DIAGNOSIS — M9902 Segmental and somatic dysfunction of thoracic region: Secondary | ICD-10-CM | POA: Diagnosis not present

## 2016-06-25 DIAGNOSIS — M5134 Other intervertebral disc degeneration, thoracic region: Secondary | ICD-10-CM | POA: Diagnosis not present

## 2016-07-23 DIAGNOSIS — M62830 Muscle spasm of back: Secondary | ICD-10-CM | POA: Diagnosis not present

## 2016-07-23 DIAGNOSIS — M9902 Segmental and somatic dysfunction of thoracic region: Secondary | ICD-10-CM | POA: Diagnosis not present

## 2016-07-23 DIAGNOSIS — M5134 Other intervertebral disc degeneration, thoracic region: Secondary | ICD-10-CM | POA: Diagnosis not present

## 2016-07-23 DIAGNOSIS — M9901 Segmental and somatic dysfunction of cervical region: Secondary | ICD-10-CM | POA: Diagnosis not present

## 2016-08-01 ENCOUNTER — Encounter: Payer: Self-pay | Admitting: Internal Medicine

## 2016-08-01 NOTE — Telephone Encounter (Signed)
Patient stated she just rolling over maybe a little inclined in the morning she felt a little dizzy, the dizziness cleared as soon as she arose and had no more episodes during the day. Patient again this morning had the sam sensation of dizziness the only thing patient has done differently is she took some lipio-flavinoids to help with her ears patient stated there is a fullness in her head and decided to try these lipo-flavinoids, she had this fullness feeling in her ears for years stated she has seen ENT to no answer as towhy she feels she is hearing through a drum. Patient is leaving town tomorrow and will not be back for 10 days no appointment s available today.

## 2016-08-19 DIAGNOSIS — M5134 Other intervertebral disc degeneration, thoracic region: Secondary | ICD-10-CM | POA: Diagnosis not present

## 2016-08-19 DIAGNOSIS — M62830 Muscle spasm of back: Secondary | ICD-10-CM | POA: Diagnosis not present

## 2016-08-19 DIAGNOSIS — M9902 Segmental and somatic dysfunction of thoracic region: Secondary | ICD-10-CM | POA: Diagnosis not present

## 2016-08-19 DIAGNOSIS — M9901 Segmental and somatic dysfunction of cervical region: Secondary | ICD-10-CM | POA: Diagnosis not present

## 2016-09-02 DIAGNOSIS — M9901 Segmental and somatic dysfunction of cervical region: Secondary | ICD-10-CM | POA: Diagnosis not present

## 2016-09-02 DIAGNOSIS — M62830 Muscle spasm of back: Secondary | ICD-10-CM | POA: Diagnosis not present

## 2016-09-02 DIAGNOSIS — M9902 Segmental and somatic dysfunction of thoracic region: Secondary | ICD-10-CM | POA: Diagnosis not present

## 2016-09-02 DIAGNOSIS — M5134 Other intervertebral disc degeneration, thoracic region: Secondary | ICD-10-CM | POA: Diagnosis not present

## 2016-09-16 DIAGNOSIS — M9902 Segmental and somatic dysfunction of thoracic region: Secondary | ICD-10-CM | POA: Diagnosis not present

## 2016-09-16 DIAGNOSIS — M62830 Muscle spasm of back: Secondary | ICD-10-CM | POA: Diagnosis not present

## 2016-09-16 DIAGNOSIS — M9901 Segmental and somatic dysfunction of cervical region: Secondary | ICD-10-CM | POA: Diagnosis not present

## 2016-09-16 DIAGNOSIS — M5134 Other intervertebral disc degeneration, thoracic region: Secondary | ICD-10-CM | POA: Diagnosis not present

## 2016-10-14 DIAGNOSIS — M9902 Segmental and somatic dysfunction of thoracic region: Secondary | ICD-10-CM | POA: Diagnosis not present

## 2016-10-14 DIAGNOSIS — M62830 Muscle spasm of back: Secondary | ICD-10-CM | POA: Diagnosis not present

## 2016-10-14 DIAGNOSIS — M5134 Other intervertebral disc degeneration, thoracic region: Secondary | ICD-10-CM | POA: Diagnosis not present

## 2016-10-14 DIAGNOSIS — M9901 Segmental and somatic dysfunction of cervical region: Secondary | ICD-10-CM | POA: Diagnosis not present

## 2016-10-31 DIAGNOSIS — H2513 Age-related nuclear cataract, bilateral: Secondary | ICD-10-CM | POA: Diagnosis not present

## 2016-11-11 DIAGNOSIS — M9902 Segmental and somatic dysfunction of thoracic region: Secondary | ICD-10-CM | POA: Diagnosis not present

## 2016-11-11 DIAGNOSIS — M5134 Other intervertebral disc degeneration, thoracic region: Secondary | ICD-10-CM | POA: Diagnosis not present

## 2016-11-11 DIAGNOSIS — M62830 Muscle spasm of back: Secondary | ICD-10-CM | POA: Diagnosis not present

## 2016-11-11 DIAGNOSIS — M9901 Segmental and somatic dysfunction of cervical region: Secondary | ICD-10-CM | POA: Diagnosis not present

## 2016-12-03 ENCOUNTER — Other Ambulatory Visit: Payer: Self-pay | Admitting: Internal Medicine

## 2016-12-09 ENCOUNTER — Other Ambulatory Visit: Payer: Self-pay | Admitting: Internal Medicine

## 2016-12-09 DIAGNOSIS — M62830 Muscle spasm of back: Secondary | ICD-10-CM | POA: Diagnosis not present

## 2016-12-09 DIAGNOSIS — M5134 Other intervertebral disc degeneration, thoracic region: Secondary | ICD-10-CM | POA: Diagnosis not present

## 2016-12-09 DIAGNOSIS — M9901 Segmental and somatic dysfunction of cervical region: Secondary | ICD-10-CM | POA: Diagnosis not present

## 2016-12-09 DIAGNOSIS — M9902 Segmental and somatic dysfunction of thoracic region: Secondary | ICD-10-CM | POA: Diagnosis not present

## 2016-12-17 DIAGNOSIS — L821 Other seborrheic keratosis: Secondary | ICD-10-CM | POA: Diagnosis not present

## 2016-12-17 DIAGNOSIS — B353 Tinea pedis: Secondary | ICD-10-CM | POA: Diagnosis not present

## 2016-12-17 DIAGNOSIS — L814 Other melanin hyperpigmentation: Secondary | ICD-10-CM | POA: Diagnosis not present

## 2016-12-17 DIAGNOSIS — D485 Neoplasm of uncertain behavior of skin: Secondary | ICD-10-CM | POA: Diagnosis not present

## 2016-12-17 DIAGNOSIS — L570 Actinic keratosis: Secondary | ICD-10-CM | POA: Diagnosis not present

## 2016-12-24 ENCOUNTER — Encounter: Payer: Self-pay | Admitting: Internal Medicine

## 2016-12-24 ENCOUNTER — Other Ambulatory Visit: Payer: Self-pay | Admitting: Internal Medicine

## 2016-12-24 DIAGNOSIS — H9312 Tinnitus, left ear: Secondary | ICD-10-CM

## 2016-12-24 DIAGNOSIS — H9311 Tinnitus, right ear: Secondary | ICD-10-CM

## 2016-12-31 DIAGNOSIS — H01009 Unspecified blepharitis unspecified eye, unspecified eyelid: Secondary | ICD-10-CM | POA: Diagnosis not present

## 2016-12-31 DIAGNOSIS — H2513 Age-related nuclear cataract, bilateral: Secondary | ICD-10-CM | POA: Diagnosis not present

## 2016-12-31 DIAGNOSIS — D23122 Other benign neoplasm of skin of left lower eyelid, including canthus: Secondary | ICD-10-CM | POA: Diagnosis not present

## 2017-01-02 DIAGNOSIS — H6123 Impacted cerumen, bilateral: Secondary | ICD-10-CM | POA: Diagnosis not present

## 2017-01-02 DIAGNOSIS — H900 Conductive hearing loss, bilateral: Secondary | ICD-10-CM | POA: Diagnosis not present

## 2017-01-06 ENCOUNTER — Ambulatory Visit: Payer: Medicare Other | Admitting: Internal Medicine

## 2017-01-06 DIAGNOSIS — M9902 Segmental and somatic dysfunction of thoracic region: Secondary | ICD-10-CM | POA: Diagnosis not present

## 2017-01-06 DIAGNOSIS — M62830 Muscle spasm of back: Secondary | ICD-10-CM | POA: Diagnosis not present

## 2017-01-06 DIAGNOSIS — M9901 Segmental and somatic dysfunction of cervical region: Secondary | ICD-10-CM | POA: Diagnosis not present

## 2017-01-06 DIAGNOSIS — M5134 Other intervertebral disc degeneration, thoracic region: Secondary | ICD-10-CM | POA: Diagnosis not present

## 2017-01-10 ENCOUNTER — Ambulatory Visit (INDEPENDENT_AMBULATORY_CARE_PROVIDER_SITE_OTHER): Payer: Medicare Other

## 2017-01-10 DIAGNOSIS — Z23 Encounter for immunization: Secondary | ICD-10-CM | POA: Diagnosis not present

## 2017-01-22 ENCOUNTER — Encounter: Payer: Self-pay | Admitting: Internal Medicine

## 2017-01-23 MED ORDER — ZOLPIDEM TARTRATE 10 MG PO TABS: 10.0000 mg | ORAL_TABLET | Freq: Every evening | ORAL | 2 refills | Status: DC | PRN

## 2017-01-23 NOTE — Telephone Encounter (Signed)
Ok to write 90 day supply for Zolpidem.

## 2017-01-23 NOTE — Telephone Encounter (Signed)
Can be sent to [facility] when signed,  Or call IN  since patient is OUT

## 2017-01-24 ENCOUNTER — Telehealth: Payer: Self-pay | Admitting: Internal Medicine

## 2017-01-24 NOTE — Telephone Encounter (Signed)
Copied from [facility] #[id number]. Topic: Quick Communication - See Telephone Encounter >> Jan 24, 2017 11:58 AM Burnis Medin, NT wrote: CRM for notification. See Telephone encounter for: Altha Harm from [facility] called in about patient wanting to get a 90 supply on Ambien. The refill already states refill for 30 days times two. Pharmacy want's to know if she can fill all 90 pills at once because patient gets a saving. Pharmacy needs a verbal.   01/24/17.

## 2017-01-24 NOTE — Telephone Encounter (Signed)
Rx has been faxed to [facility] per Juliann Pulse who spoke with the pt.

## 2017-01-24 NOTE — Telephone Encounter (Signed)
Please advise 

## 2017-01-25 NOTE — Telephone Encounter (Signed)
I do not like refilling controlled substances for 90 days because of the liability, also the patient has not been seen since Dec 2017, so stock with the 30 day for now

## 2017-01-27 DIAGNOSIS — M62830 Muscle spasm of back: Secondary | ICD-10-CM | POA: Diagnosis not present

## 2017-01-27 DIAGNOSIS — M9902 Segmental and somatic dysfunction of thoracic region: Secondary | ICD-10-CM | POA: Diagnosis not present

## 2017-01-27 DIAGNOSIS — M9901 Segmental and somatic dysfunction of cervical region: Secondary | ICD-10-CM | POA: Diagnosis not present

## 2017-01-27 DIAGNOSIS — M5134 Other intervertebral disc degeneration, thoracic region: Secondary | ICD-10-CM | POA: Diagnosis not present

## 2017-01-28 ENCOUNTER — Encounter: Payer: Self-pay | Admitting: Internal Medicine

## 2017-01-29 NOTE — Telephone Encounter (Signed)
Spoke with pharmacy and informed them of Dr. Lupita Dawn message below. Pharmacy gave a verbal understanding.

## 2017-02-03 ENCOUNTER — Telehealth: Payer: Self-pay

## 2017-02-03 DIAGNOSIS — Z1239 Encounter for other screening for malignant neoplasm of breast: Secondary | ICD-10-CM

## 2017-02-03 NOTE — Telephone Encounter (Signed)
Screening mammogram has been ordered pt has been notified through [employer].

## 2017-02-14 DIAGNOSIS — D225 Melanocytic nevi of trunk: Secondary | ICD-10-CM | POA: Diagnosis not present

## 2017-02-14 DIAGNOSIS — D1801 Hemangioma of skin and subcutaneous tissue: Secondary | ICD-10-CM | POA: Diagnosis not present

## 2017-02-14 DIAGNOSIS — B353 Tinea pedis: Secondary | ICD-10-CM | POA: Diagnosis not present

## 2017-02-14 DIAGNOSIS — L814 Other melanin hyperpigmentation: Secondary | ICD-10-CM | POA: Diagnosis not present

## 2017-02-14 DIAGNOSIS — Z85828 Personal history of other malignant neoplasm of skin: Secondary | ICD-10-CM | POA: Diagnosis not present

## 2017-02-14 DIAGNOSIS — L570 Actinic keratosis: Secondary | ICD-10-CM | POA: Diagnosis not present

## 2017-02-25 DIAGNOSIS — M62830 Muscle spasm of back: Secondary | ICD-10-CM | POA: Diagnosis not present

## 2017-02-25 DIAGNOSIS — M9901 Segmental and somatic dysfunction of cervical region: Secondary | ICD-10-CM | POA: Diagnosis not present

## 2017-02-25 DIAGNOSIS — M9902 Segmental and somatic dysfunction of thoracic region: Secondary | ICD-10-CM | POA: Diagnosis not present

## 2017-02-25 DIAGNOSIS — M5134 Other intervertebral disc degeneration, thoracic region: Secondary | ICD-10-CM | POA: Diagnosis not present

## 2017-03-02 ENCOUNTER — Encounter: Payer: Self-pay | Admitting: Internal Medicine

## 2017-03-04 ENCOUNTER — Ambulatory Visit
Admission: RE | Admit: 2017-03-04 | Discharge: 2017-03-04 | Disposition: A | Discharge status: Home / Self Care | Payer: Medicare Other | Source: Ambulatory Visit | Attending: Internal Medicine | Admitting: Internal Medicine

## 2017-03-04 DIAGNOSIS — Z1239 Encounter for other screening for malignant neoplasm of breast: Secondary | ICD-10-CM

## 2017-03-04 DIAGNOSIS — Z1231 Encounter for screening mammogram for malignant neoplasm of breast: Secondary | ICD-10-CM | POA: Diagnosis not present

## 2017-03-31 DIAGNOSIS — M5134 Other intervertebral disc degeneration, thoracic region: Secondary | ICD-10-CM | POA: Diagnosis not present

## 2017-03-31 DIAGNOSIS — M62830 Muscle spasm of back: Secondary | ICD-10-CM | POA: Diagnosis not present

## 2017-03-31 DIAGNOSIS — M9901 Segmental and somatic dysfunction of cervical region: Secondary | ICD-10-CM | POA: Diagnosis not present

## 2017-03-31 DIAGNOSIS — M9902 Segmental and somatic dysfunction of thoracic region: Secondary | ICD-10-CM | POA: Diagnosis not present

## 2017-04-02 ENCOUNTER — Encounter: Payer: Self-pay | Admitting: Internal Medicine

## 2017-04-02 ENCOUNTER — Encounter: Payer: Medicare Other | Admitting: Internal Medicine

## 2017-04-02 ENCOUNTER — Ambulatory Visit (INDEPENDENT_AMBULATORY_CARE_PROVIDER_SITE_OTHER): Payer: Medicare Other | Admitting: Internal Medicine

## 2017-04-02 VITALS — BP 114/76 | HR 93 | Temp 98.3°F | Resp 15 | Ht 66.0 in | Wt 166.6 lb

## 2017-04-02 DIAGNOSIS — E782 Mixed hyperlipidemia: Secondary | ICD-10-CM

## 2017-04-02 DIAGNOSIS — F5101 Primary insomnia: Secondary | ICD-10-CM

## 2017-04-02 DIAGNOSIS — E559 Vitamin D deficiency, unspecified: Secondary | ICD-10-CM | POA: Diagnosis not present

## 2017-04-02 DIAGNOSIS — N952 Postmenopausal atrophic vaginitis: Secondary | ICD-10-CM | POA: Diagnosis not present

## 2017-04-02 DIAGNOSIS — Z1211 Encounter for screening for malignant neoplasm of colon: Secondary | ICD-10-CM | POA: Diagnosis not present

## 2017-04-02 DIAGNOSIS — R5383 Other fatigue: Secondary | ICD-10-CM

## 2017-04-02 DIAGNOSIS — Z13220 Encounter for screening for lipoid disorders: Secondary | ICD-10-CM | POA: Diagnosis not present

## 2017-04-02 DIAGNOSIS — G4733 Obstructive sleep apnea (adult) (pediatric): Secondary | ICD-10-CM

## 2017-04-02 LAB — LIPID PANEL
CHOL/HDL RATIO: 4
Cholesterol: 199 mg/dL (ref 0–200)
HDL: 50.1 mg/dL (ref >39.00)
LDL CALC: 130 mg/dL — AB (ref 0–99)
NONHDL: 148.6
TRIGLYCERIDES: 94 mg/dL (ref 0.0–149.0)
VLDL: 18.8 mg/dL (ref 0.0–40.0)

## 2017-04-02 LAB — COMPREHENSIVE METABOLIC PANEL
ALT: 19 U/L (ref 0–35)
AST: 16 U/L (ref 0–37)
Albumin: 4.1 g/dL (ref 3.5–5.2)
Alkaline Phosphatase: 71 U/L (ref 39–117)
BILIRUBIN TOTAL: 0.4 mg/dL (ref 0.2–1.2)
BUN: 15 mg/dL (ref 6–23)
CHLORIDE: 102 meq/L (ref 96–112)
CO2: 30 meq/L (ref 19–32)
CREATININE: 0.78 mg/dL (ref 0.40–1.20)
Calcium: 9.2 mg/dL (ref 8.4–10.5)
GFR: 77.84 mL/min (ref >60.00)
GLUCOSE: 105 mg/dL — AB (ref 70–99)
Potassium: 4.8 mEq/L (ref 3.5–5.1)
SODIUM: 139 meq/L (ref 135–145)
Total Protein: 7.4 g/dL (ref 6.0–8.3)

## 2017-04-02 LAB — VITAMIN D 25 HYDROXY (VIT D DEFICIENCY, FRACTURES): VITD: 38.43 ng/mL (ref 30.00–100.00)

## 2017-04-02 LAB — TSH: TSH: 1.42 u[IU]/mL (ref 0.35–4.50)

## 2017-04-02 MED ORDER — ESTRADIOL 2 MG VA RING: 2.0000 mg | VAGINAL_RING | VAGINAL | 12 refills | Status: DC

## 2017-04-02 MED ORDER — ESTROGENS, CONJUGATED 0.625 MG/GM VA CREA: TOPICAL_CREAM | VAGINAL | 12 refills | Status: DC

## 2017-04-02 MED ORDER — ZOSTER VAC RECOMB ADJUVANTED 50 MCG/0.5ML IM SUSR: 0.5000 mL | Freq: Once | INTRAMUSCULAR | 1 refills | Status: AC

## 2017-04-02 MED ORDER — ZOLPIDEM TARTRATE 10 MG PO TABS: 10.0000 mg | ORAL_TABLET | Freq: Every evening | ORAL | 5 refills | Status: DC | PRN

## 2017-04-02 NOTE — Patient Instructions (Signed)
I gave you 2 options for vaginal estrogen, along with coupons  The estring is a ring you insert in your vagina and leave in for 3 months  The Premarin is a cream,  Use it just like you used estrace (nightly for 2 weeks, then every 3 days thereafter)    I will initiate the order for your colon cancer screening  Test.  It is called  Cologuard.  It will be delivered to your house, and you will send off a stool sample in the envelope it provides.     Health Maintenance for Postmenopausal Women Menopause is a normal process in which your reproductive ability comes to an end. This process happens gradually over a span of months to years, usually between the ages of 82 and 43. Menopause is complete when you have missed 12 consecutive menstrual periods. It is important to talk with your health care provider about some of the most common conditions that affect postmenopausal women, such as heart disease, cancer, and bone loss (osteoporosis). Adopting a healthy lifestyle and getting preventive care can help to promote your health and wellness. Those actions can also lower your chances of developing some of these common conditions. What should I know about menopause? During menopause, you may experience a number of symptoms, such as:  Moderate-to-severe hot flashes.  Night sweats.  Decrease in sex drive.  Mood swings.  Headaches.  Tiredness.  Irritability.  Memory problems.  Insomnia.  Choosing to treat or not to treat menopausal changes is an individual decision that you make with your health care provider. What should I know about hormone replacement therapy and supplements? Hormone therapy products are effective for treating symptoms that are associated with menopause, such as hot flashes and night sweats. Hormone replacement carries certain risks, especially as you become older. If you are thinking about using estrogen or estrogen with progestin treatments, discuss the benefits and risks  with your health care provider. What should I know about heart disease and stroke? Heart disease, heart attack, and stroke become more likely as you age. This may be due, in part, to the hormonal changes that your body experiences during menopause. These can affect how your body processes dietary fats, triglycerides, and cholesterol. Heart attack and stroke are both medical emergencies. There are many things that you can do to help prevent heart disease and stroke:  Have your blood pressure checked at least every 1-2 years. High blood pressure causes heart disease and increases the risk of stroke.  If you are 52-80 years old, ask your health care provider if you should take aspirin to prevent a heart attack or a stroke.  Do not use any tobacco products, including cigarettes, chewing tobacco, or electronic cigarettes. If you need help quitting, ask your health care provider.  It is important to eat a healthy diet and maintain a healthy weight. ? Be sure to include plenty of vegetables, fruits, low-fat dairy products, and lean protein. ? Avoid eating foods that are high in solid fats, added sugars, or salt (sodium).  Get regular exercise. This is one of the most important things that you can do for your health. ? Try to exercise for at least 150 minutes each week. The type of exercise that you do should increase your heart rate and make you sweat. This is known as moderate-intensity exercise. ? Try to do strengthening exercises at least twice each week. Do these in addition to the moderate-intensity exercise.  Know your numbers.Ask your health care  provider to check your cholesterol and your blood glucose. Continue to have your blood tested as directed by your health care provider.  What should I know about cancer screening? There are several types of cancer. Take the following steps to reduce your risk and to catch any cancer development as early as possible. Breast Cancer  Practice breast  self-awareness. ? This means understanding how your breasts normally appear and feel. ? It also means doing regular breast self-exams. Let your health care provider know about any changes, no matter how small.  If you are 58 or older, have a clinician do a breast exam (clinical breast exam or CBE) every year. Depending on your age, family history, and medical history, it may be recommended that you also have a yearly breast X-ray (mammogram).  If you have a family history of breast cancer, talk with your health care provider about genetic screening.  If you are at high risk for breast cancer, talk with your health care provider about having an MRI and a mammogram every year.  Breast cancer (BRCA) gene test is recommended for women who have family members with BRCA-related cancers. Results of the assessment will determine the need for genetic counseling and BRCA1 and for BRCA2 testing. BRCA-related cancers include these types: ? Breast. This occurs in males or females. ? Ovarian. ? Tubal. This may also be called fallopian tube cancer. ? Cancer of the abdominal or pelvic lining (peritoneal cancer). ? Prostate. ? Pancreatic.  Cervical, Uterine, and Ovarian Cancer Your health care provider may recommend that you be screened regularly for cancer of the pelvic organs. These include your ovaries, uterus, and vagina. This screening involves a pelvic exam, which includes checking for microscopic changes to the surface of your cervix (Pap test).  For women ages 21-65, health care providers may recommend a pelvic exam and a Pap test every three years. For women ages 60-65, they may recommend the Pap test and pelvic exam, combined with testing for human papilloma virus (HPV), every five years. Some types of HPV increase your risk of cervical cancer. Testing for HPV may also be done on women of any age who have unclear Pap test results.  Other health care providers may not recommend any screening for  nonpregnant women who are considered low risk for pelvic cancer and have no symptoms. Ask your health care provider if a screening pelvic exam is right for you.  If you have had past treatment for cervical cancer or a condition that could lead to cancer, you need Pap tests and screening for cancer for at least 20 years after your treatment. If Pap tests have been discontinued for you, your risk factors (such as having a new sexual partner) need to be reassessed to determine if you should start having screenings again. Some women have medical problems that increase the chance of getting cervical cancer. In these cases, your health care provider may recommend that you have screening and Pap tests more often.  If you have a family history of uterine cancer or ovarian cancer, talk with your health care provider about genetic screening.  If you have vaginal bleeding after reaching menopause, tell your health care provider.  There are currently no reliable tests available to screen for ovarian cancer.  Lung Cancer Lung cancer screening is recommended for adults 55-33 years old who are at high risk for lung cancer because of a history of smoking. A yearly low-dose CT scan of the lungs is recommended if you:  Currently smoke.  Have a history of at least 30 pack-years of smoking and you currently smoke or have quit within the past 15 years. A pack-year is smoking an average of one pack of cigarettes per day for one year.  Yearly screening should:  Continue until it has been 15 years since you quit.  Stop if you develop a health problem that would prevent you from having lung cancer treatment.  Colorectal Cancer  This type of cancer can be detected and can often be prevented.  Routine colorectal cancer screening usually begins at age 93 and continues through age 47.  If you have risk factors for colon cancer, your health care provider may recommend that you be screened at an earlier age.  If you  have a family history of colorectal cancer, talk with your health care provider about genetic screening.  Your health care provider may also recommend using home test kits to check for hidden blood in your stool.  A small camera at the end of a tube can be used to examine your colon directly (sigmoidoscopy or colonoscopy). This is done to check for the earliest forms of colorectal cancer.  Direct examination of the colon should be repeated every 5-10 years until age 32. However, if early forms of precancerous polyps or small growths are found or if you have a family history or genetic risk for colorectal cancer, you may need to be screened more often.  Skin Cancer  Check your skin from head to toe regularly.  Monitor any moles. Be sure to tell your health care provider: ? About any new moles or changes in moles, especially if there is a change in a mole's shape or color. ? If you have a mole that is larger than the size of a pencil eraser.  If any of your family members has a history of skin cancer, especially at a young age, talk with your health care provider about genetic screening.  Always use sunscreen. Apply sunscreen liberally and repeatedly throughout the day.  Whenever you are outside, protect yourself by wearing long sleeves, pants, a wide-brimmed hat, and sunglasses.  What should I know about osteoporosis? Osteoporosis is a condition in which bone destruction happens more quickly than new bone creation. After menopause, you may be at an increased risk for osteoporosis. To help prevent osteoporosis or the bone fractures that can happen because of osteoporosis, the following is recommended:  If you are 5-51 years old, get at least 1,000 mg of calcium and at least 600 mg of vitamin D per day.  If you are older than age 79 but younger than age 32, get at least 1,200 mg of calcium and at least 600 mg of vitamin D per day.  If you are older than age 32, get at least 1,200 mg of  calcium and at least 800 mg of vitamin D per day.  Smoking and excessive alcohol intake increase the risk of osteoporosis. Eat foods that are rich in calcium and vitamin D, and do weight-bearing exercises several times each week as directed by your health care provider. What should I know about how menopause affects my mental health? Depression may occur at any age, but it is more common as you become older. Common symptoms of depression include:  Low or sad mood.  Changes in sleep patterns.  Changes in appetite or eating patterns.  Feeling an overall lack of motivation or enjoyment of activities that you previously enjoyed.  Frequent crying spells.  Talk with your health care provider if you think that you are experiencing depression. What should I know about immunizations? It is important that you get and maintain your immunizations. These include:  Tetanus, diphtheria, and pertussis (Tdap) booster vaccine.  Influenza every year before the flu season begins.  Pneumonia vaccine.  Shingles vaccine.  Your health care provider may also recommend other immunizations. This information is not intended to replace advice given to you by your health care provider. Make sure you discuss any questions you have with your health care provider. Document Released: 04/26/2005 Document Revised: 09/22/2015 Document Reviewed: 12/06/2014 Elsevier Interactive Patient Education  2018 [employer].

## 2017-04-02 NOTE — Progress Notes (Signed)
Patient ID: Christina Hammond, female    DOB: [birth year]  Age: 67 y.o. MRN: [id number]  The patient is here for FOLLOW UP AND  management of other chronic and acute problems.  Mammogram normal Dec 2018 FOBT neg 2015, colon normal 2008  shingrx discussed     The risk factors are reflected in the social history.  The roster of all physicians providing medical care to patient - is listed in the Snapshot section of the chart.  Activities of daily living:  The patient is 100% independent in all ADLs: dressing, toileting, feeding as well as independent mobility  Home safety : The patient has smoke detectors in the home. They wear seatbelts.  There are no firearms at home. There is no violence in the home.   There is no risks for hepatitis, STDs or HIV. There is no   history of blood transfusion. They have no travel history to infectious disease endemic areas of the world.  The patient has seen their dentist in the last six month. They have seen their eye doctor in the last year. They deny hearing difficulty with regard to whispered voices and some television programs.  They have deferred audiologic testing in the last year.  They do not  have excessive sun exposure. Discussed the need for sun protection: hats, long sleeves and use of sunscreen if there is significant sun exposure.   Diet: the importance of a healthy diet is discussed. They do have a healthy diet.  The benefits of regular aerobic exercise were discussed. She exercises 4 times per week ,  20 minutes.   Depression screen: there are no signs or vegative symptoms of depression- irritability, change in appetite, anhedonia, sadness/tearfullness.  Cognitive assessment: the patient manages all their financial and personal affairs and is actively engaged. They could relate day,date,year and events; recalled 2/3 objects at 3 minutes; performed clock-face test normally.  The following portions of the patient's history were reviewed and  updated as appropriate: allergies, current medications, past family history, past medical history,  past surgical history, past social history  and problem list.  Visual acuity was not assessed per patient preference since she has regular follow up with her ophthalmologist. Hearing and body mass index were assessed and reviewed.   During the course of the visit the patient was educated and counseled about appropriate screening and preventive services including : fall prevention , diabetes screening, nutrition counseling, colorectal cancer screening, and recommended immunizations.    CC: The primary encounter diagnosis was Vitamin D deficiency. Diagnoses of Special screening for malignant neoplasms, colon, Mixed hyperlipidemia, Fatigue, unspecified type, Postmenopausal atrophic vaginitis, OSA (obstructive sleep apnea), Screening for lipoid disorders, and Primary insomnia were also pertinent to this visit.  Patient states that she is very happy with her life situation and enjoys a healthy sexual relationship with her husband.     She is wearing her CPAP every night a minimum of 6 hours per night and notes improved daytime wakefulness and decreased fatigue   History Christina Hammond has a past medical history of Cancer ([place]) and Restless legs syndrome (RLS).   She has a past surgical history that includes Abdominal hysterectomy (1979); Cholecystectomy; Appendectomy (1956); and Cesarean section.   Her family history includes Alcohol abuse in her maternal uncle; Cancer in her father and maternal aunt; Diabetes in her mother; Heart disease in her father; Mental retardation in her maternal aunt; Stroke in her mother.She reports that  has never smoked. she has never used smokeless  tobacco. She reports that she drinks alcohol. She reports that she does not use drugs.  Outpatient Medications Prior to Visit  Medication Sig Dispense Refill  . estradiol (ESTRACE VAGINAL) 0.1 MG/GM vaginal cream Place 1 Applicatorful  vaginally at bedtime. For 2 weeks,  Then 3 times weekly thereafter 42.5 g 12  . mometasone (NASONEX) 50 MCG/ACT nasal spray Place 2 sprays into the nose daily. 17 g 12  . zolpidem (AMBIEN) 10 MG tablet Take 1 tablet (10 mg total) at bedtime as needed by mouth. for sleep 30 tablet 2  . calcium carbonate (OS-CAL) 600 MG TABS Take 600 mg by mouth 2 (two) times daily with a meal.     No facility-administered medications prior to visit.     Review of Systems   Patient denies headache, fevers, malaise, unintentional weight loss, skin rash, eye pain, sinus congestion and sinus pain, sore throat, dysphagia,  hemoptysis , cough, dyspnea, wheezing, chest pain, palpitations, orthopnea, edema, abdominal pain, nausea, melena, diarrhea, constipation, flank pain, dysuria, hematuria, urinary  Frequency, nocturia, numbness, tingling, seizures,  Focal weakness, Loss of consciousness,  Tremor, insomnia, depression, anxiety, and suicidal ideation.      Objective:  BP 114/76 (BP Location: Left Arm, Patient Position: Sitting, Cuff Size: Normal)   Pulse 93   Temp 98.3 F (36.8 C) (Oral)   Resp 15   Ht 5\' 6"  (1.676 m)   Wt 166 lb 9.6 oz (75.6 kg)   SpO2 97%   BMI 26.89 kg/m   Physical Exam   General appearance: alert, cooperative and appears stated age Ears: normal TM's and external ear canals both ears Throat: lips, mucosa, and tongue normal; teeth and gums normal Neck: no adenopathy, no carotid bruit, supple, symmetrical, trachea midline and thyroid not enlarged, symmetric, no tenderness/mass/nodules Back: symmetric, no curvature. ROM normal. No CVA tenderness. Lungs: clear to auscultation bilaterally Heart: regular rate and rhythm, S1, S2 normal, no murmur, click, rub or gallop Abdomen: soft, non-tender; bowel sounds normal; no masses,  no organomegaly Pulses: 2+ and symmetric Skin: Skin color, texture, turgor normal. No rashes or lesions Lymph nodes: Cervical, supraclavicular, and axillary nodes  normal.    Assessment & Plan:   Problem List Items Addressed This Visit    Fatigue    Resolved with management of OSA diagnosed by formal sleep study      Relevant Orders   TSH (Completed)   Insomnia    Chronic , managed with 10  Mg ambien. Refills given. The risks and benefits of use of hypnotics were discussed with patient today including excessive sedation leading to respiratory depression,  impaired thinking/driving, and addiction.  Patient was advised to avoid concurrent use with alcohol, to use medication only as needed and not to share with others  .       OSA (obstructive sleep apnea)    Diagnosed by sleep study. She is wearing her CPAP every night a minimum of 6 hours per night and notes improved daytime wakefulness and decreased fatigue       Postmenopausal atrophic vaginitis    She requests to resume therapy.  She was given the options of Estring and Premarin       Screening for lipoid disorders     cholesterol is below the threshhold for treatment based on your ten year risk of heart attack or stroke being  Calculated as less than 8% (this was determined  by application of the Framingham Risk calculator  Lab Results  Component Value Date  CHOL 199 04/02/2017   HDL 50.10 04/02/2017   LDLCALC 130 (H) 04/02/2017   TRIG 94.0 04/02/2017   CHOLHDL 4 04/02/2017          Special screening for malignant neoplasms, colon    Colonoscopy vs cologuard discussed .  Last screening was done in 2008 , an FOBTin 215 was negative .  cologuard to be done.        Other Visit Diagnoses    Vitamin D deficiency    -  Primary   Relevant Orders   VITAMIN D 25 Hydroxy (Vit-D Deficiency, Fractures) (Completed)   Mixed hyperlipidemia       Relevant Orders   Lipid panel (Completed)   Comprehensive metabolic panel (Completed)      I have discontinued Christina Hammond L. Houp's calcium carbonate. I have also changed her zolpidem. Additionally, I am having her start on conjugated  estrogens, estradiol, and Zoster Vaccine Adjuvanted. Lastly, I am having her maintain her mometasone and estradiol.  Meds ordered this encounter  Medications  . conjugated estrogens (PREMARIN) vaginal cream    Sig: 1 GRAM INTRAVAGINALLY EVERY NIGHT FOR TWO WEEKS,  THEN REDUCE USE TO TWICE WEEKLY    Dispense:  42.5 g    Refill:  12  . estradiol (ESTRING) 2 MG vaginal ring    Sig: Place 2 mg vaginally every 3 (three) months. follow package directions    Dispense:  1 each    Refill:  12  . zolpidem (AMBIEN) 10 MG tablet    Sig: Take 1 tablet (10 mg total) by mouth at bedtime as needed. for sleep    Dispense:  30 tablet    Refill:  5  . Zoster Vaccine Adjuvanted [facility]) injection    Sig: Inject 0.5 mLs into the muscle once for 1 dose.    Dispense:  1 each    Refill:  1  A total of 40 minutes was spent with patient more than half of which was spent in counseling patient on the above mentioned issues , reviewing and explaining recent labs and imaging studies done, and coordination of care.  Medications Discontinued During This Encounter  Medication Reason  . calcium carbonate (OS-CAL) 600 MG TABS Patient Preference  . zolpidem (AMBIEN) 10 MG tablet Reorder    Follow-up: Return in about 1 year (around 04/02/2018).   Crecencio Mc, MD

## 2017-04-02 NOTE — Assessment & Plan Note (Addendum)
Colonoscopy vs cologuard discussed .  Last screening was done in 2008 , an FOBTin 215 was negative .  cologuard to be done.

## 2017-04-03 DIAGNOSIS — G4700 Insomnia, unspecified: Secondary | ICD-10-CM | POA: Insufficient documentation

## 2017-04-03 NOTE — Assessment & Plan Note (Signed)
Diagnosed by sleep study. She is wearing her CPAP every night a minimum of 6 hours per night and notes improved daytime wakefulness and decreased fatigue  

## 2017-04-03 NOTE — Assessment & Plan Note (Signed)
Resolved with management of OSA diagnosed by formal sleep study

## 2017-04-03 NOTE — Assessment & Plan Note (Signed)
She requests to resume therapy.  She was given the options of Estring and Premarin

## 2017-04-03 NOTE — Assessment & Plan Note (Addendum)
Chronic , managed with 10  Mg ambien. Refills given. The risks and benefits of use of hypnotics were discussed with patient today including excessive sedation leading to respiratory depression,  impaired thinking/driving, and addiction.  Patient was advised to avoid concurrent use with alcohol, to use medication only as needed and not to share with others  .

## 2017-04-03 NOTE — Assessment & Plan Note (Signed)
cholesterol is below the threshhold for treatment based on your ten year risk of heart attack or stroke being  Calculated as less than 8% (this was determined  by application of the Framingham Risk calculator  Lab Results  Component Value Date   CHOL 199 04/02/2017   HDL 50.10 04/02/2017   LDLCALC 130 (H) 04/02/2017   TRIG 94.0 04/02/2017   CHOLHDL 4 04/02/2017

## 2017-04-30 DIAGNOSIS — M9901 Segmental and somatic dysfunction of cervical region: Secondary | ICD-10-CM | POA: Diagnosis not present

## 2017-04-30 DIAGNOSIS — M62830 Muscle spasm of back: Secondary | ICD-10-CM | POA: Diagnosis not present

## 2017-04-30 DIAGNOSIS — M9902 Segmental and somatic dysfunction of thoracic region: Secondary | ICD-10-CM | POA: Diagnosis not present

## 2017-04-30 DIAGNOSIS — M5134 Other intervertebral disc degeneration, thoracic region: Secondary | ICD-10-CM | POA: Diagnosis not present

## 2017-05-26 DIAGNOSIS — M5134 Other intervertebral disc degeneration, thoracic region: Secondary | ICD-10-CM | POA: Diagnosis not present

## 2017-05-26 DIAGNOSIS — M9901 Segmental and somatic dysfunction of cervical region: Secondary | ICD-10-CM | POA: Diagnosis not present

## 2017-05-26 DIAGNOSIS — M62830 Muscle spasm of back: Secondary | ICD-10-CM | POA: Diagnosis not present

## 2017-05-26 DIAGNOSIS — M9902 Segmental and somatic dysfunction of thoracic region: Secondary | ICD-10-CM | POA: Diagnosis not present

## 2017-05-29 DIAGNOSIS — D2239 Melanocytic nevi of other parts of face: Secondary | ICD-10-CM | POA: Diagnosis not present

## 2017-05-29 DIAGNOSIS — D23121 Other benign neoplasm of skin of left upper eyelid, including canthus: Secondary | ICD-10-CM | POA: Diagnosis not present

## 2017-05-29 DIAGNOSIS — H01009 Unspecified blepharitis unspecified eye, unspecified eyelid: Secondary | ICD-10-CM | POA: Diagnosis not present

## 2017-05-29 DIAGNOSIS — H2513 Age-related nuclear cataract, bilateral: Secondary | ICD-10-CM | POA: Diagnosis not present

## 2017-06-23 DIAGNOSIS — M9902 Segmental and somatic dysfunction of thoracic region: Secondary | ICD-10-CM | POA: Diagnosis not present

## 2017-06-23 DIAGNOSIS — M62830 Muscle spasm of back: Secondary | ICD-10-CM | POA: Diagnosis not present

## 2017-06-23 DIAGNOSIS — M5134 Other intervertebral disc degeneration, thoracic region: Secondary | ICD-10-CM | POA: Diagnosis not present

## 2017-06-23 DIAGNOSIS — M9901 Segmental and somatic dysfunction of cervical region: Secondary | ICD-10-CM | POA: Diagnosis not present

## 2017-06-24 DIAGNOSIS — M9902 Segmental and somatic dysfunction of thoracic region: Secondary | ICD-10-CM | POA: Diagnosis not present

## 2017-06-24 DIAGNOSIS — M9901 Segmental and somatic dysfunction of cervical region: Secondary | ICD-10-CM | POA: Diagnosis not present

## 2017-06-24 DIAGNOSIS — M62830 Muscle spasm of back: Secondary | ICD-10-CM | POA: Diagnosis not present

## 2017-06-24 DIAGNOSIS — M5134 Other intervertebral disc degeneration, thoracic region: Secondary | ICD-10-CM | POA: Diagnosis not present

## 2017-07-11 ENCOUNTER — Encounter: Payer: Self-pay | Admitting: Internal Medicine

## 2017-07-21 DIAGNOSIS — M62830 Muscle spasm of back: Secondary | ICD-10-CM | POA: Diagnosis not present

## 2017-07-21 DIAGNOSIS — M5134 Other intervertebral disc degeneration, thoracic region: Secondary | ICD-10-CM | POA: Diagnosis not present

## 2017-07-21 DIAGNOSIS — M9902 Segmental and somatic dysfunction of thoracic region: Secondary | ICD-10-CM | POA: Diagnosis not present

## 2017-07-21 DIAGNOSIS — M9901 Segmental and somatic dysfunction of cervical region: Secondary | ICD-10-CM | POA: Diagnosis not present

## 2017-07-28 ENCOUNTER — Encounter: Payer: Self-pay | Admitting: Internal Medicine

## 2017-07-28 ENCOUNTER — Telehealth: Payer: Self-pay

## 2017-07-28 DIAGNOSIS — Z1211 Encounter for screening for malignant neoplasm of colon: Secondary | ICD-10-CM

## 2017-07-28 NOTE — Telephone Encounter (Signed)
Reordered cologuard. Pt stated that she never received.

## 2017-08-06 DIAGNOSIS — Z1211 Encounter for screening for malignant neoplasm of colon: Secondary | ICD-10-CM | POA: Diagnosis not present

## 2017-08-06 DIAGNOSIS — Z1212 Encounter for screening for malignant neoplasm of rectum: Secondary | ICD-10-CM | POA: Diagnosis not present

## 2017-08-07 LAB — COLOGUARD: Cologuard: NEGATIVE

## 2017-08-18 DIAGNOSIS — M62830 Muscle spasm of back: Secondary | ICD-10-CM | POA: Diagnosis not present

## 2017-08-18 DIAGNOSIS — M9901 Segmental and somatic dysfunction of cervical region: Secondary | ICD-10-CM | POA: Diagnosis not present

## 2017-08-18 DIAGNOSIS — M5134 Other intervertebral disc degeneration, thoracic region: Secondary | ICD-10-CM | POA: Diagnosis not present

## 2017-08-18 DIAGNOSIS — M9902 Segmental and somatic dysfunction of thoracic region: Secondary | ICD-10-CM | POA: Diagnosis not present

## 2017-08-25 ENCOUNTER — Telehealth: Payer: Self-pay | Admitting: Internal Medicine

## 2017-08-25 NOTE — Telephone Encounter (Signed)
The results of patient's cologuard is negative.   MyChart message sent and results abstracted

## 2017-08-25 NOTE — Telephone Encounter (Signed)
Pt has been notified of result and it has been abstracted in the pt's chart.

## 2017-09-15 DIAGNOSIS — M5134 Other intervertebral disc degeneration, thoracic region: Secondary | ICD-10-CM | POA: Diagnosis not present

## 2017-09-15 DIAGNOSIS — M62830 Muscle spasm of back: Secondary | ICD-10-CM | POA: Diagnosis not present

## 2017-09-15 DIAGNOSIS — M9902 Segmental and somatic dysfunction of thoracic region: Secondary | ICD-10-CM | POA: Diagnosis not present

## 2017-09-15 DIAGNOSIS — M9901 Segmental and somatic dysfunction of cervical region: Secondary | ICD-10-CM | POA: Diagnosis not present

## 2017-09-16 ENCOUNTER — Ambulatory Visit (INDEPENDENT_AMBULATORY_CARE_PROVIDER_SITE_OTHER): Payer: Medicare Other

## 2017-09-16 VITALS — BP 128/70 | HR 78 | Temp 98.4°F | Resp 14 | Ht 67.0 in | Wt 167.8 lb

## 2017-09-16 DIAGNOSIS — Z0000 Encounter for general adult medical examination without abnormal findings: Secondary | ICD-10-CM

## 2017-09-16 NOTE — Patient Instructions (Addendum)
  Christina Hammond , Thank you for taking time to come for your Medicare Wellness Visit. I appreciate your ongoing commitment to your health goals. Please review the following plan we discussed and let me know if I can assist you in the future.   Routine maintenance appointments.   These are the goals we discussed: Goals    . Maintain Healthy Lifestyle       This is a list of the screening recommended for you and due dates:  Health Maintenance  Topic Date Due  . Flu Shot  10/16/2017  . Mammogram  03/04/2018  . Cologuard (Stool DNA test)  08/06/2020  . Tetanus Vaccine  10/10/2021  . DEXA scan (bone density measurement)  Completed  .  Hepatitis C: One time screening is recommended by Center for Disease Control  (CDC) for  adults born from [birth year] through 1967.   Completed  . Pneumonia vaccines  Completed

## 2017-09-16 NOTE — Progress Notes (Addendum)
Subjective:   Christina Hammond is a 67 y.o. female who presents for Medicare Annual (Subsequent) preventive examination.  Review of Systems:  No ROS.  Medicare Wellness Visit. Additional risk factors are reflected in the social history. Cardiac Risk Factors include: advanced age (>30men, >62 women)     Objective:     Vitals: BP 128/70 (BP Location: Left Arm, Patient Position: Sitting, Cuff Size: Normal)   Pulse 78   Temp 98.4 F (36.9 C) (Oral)   Resp 14   Ht 5\' 7"  (1.702 m)   Wt 167 lb 12.8 oz (76.1 kg)   SpO2 97%   BMI 26.28 kg/m   Body mass index is 26.28 kg/m.  Advanced Directives 09/16/2017  Does Patient Have a Medical Advance Directive? Yes  Type of Advance Directive [place]  Does patient want to make changes to medical advance directive? No - Patient declined    Tobacco Social History   Tobacco Use  Smoking Status Never Smoker  Smokeless Tobacco Never Used     Counseling given: Not Answered   Clinical Intake:  Pre-visit preparation completed: Yes  Pain : No/denies pain        How often do you need to have someone help you when you read instructions, pamphlets, or other written materials from your doctor or pharmacy?: 1 - Never  Interpreter Needed?: No     Past Medical History:  Diagnosis Date  . Cancer ([place])    basal cell,  [facility]   . Restless legs syndrome (RLS)    managed with minimal meds   Past Surgical History:  Procedure Laterality Date  . ABDOMINAL HYSTERECTOMY  1979  . APPENDECTOMY  1956  . CESAREAN SECTION    . CHOLECYSTECTOMY     Family History  Problem Relation Age of Onset  . Diabetes Mother   . Stroke Mother   . Cancer Father   . Heart disease Father   . Cancer Maternal Aunt   . Mental retardation Maternal Aunt   . Alcohol abuse Maternal Uncle    Social History   Socioeconomic History  . Marital status: Married    Spouse name: Not on file  . Number of children: Not on file  . Years of  education: Not on file  . Highest education level: Not on file  Occupational History  . Occupation: real Conservation officer, historic buildings  Social Needs  . Financial resource strain: Not on file  . Food insecurity:    Worry: Never true    Inability: Never true  . Transportation needs:    Medical: No    Non-medical: No  Tobacco Use  . Smoking status: Never Smoker  . Smokeless tobacco: Never Used  Substance and Sexual Activity  . Alcohol use: Yes  . Drug use: No  . Sexual activity: Not on file  Lifestyle  . Physical activity:    Days per week: 0 days    Minutes per session: Not on file  . Stress: Not on file  Relationships  . Social connections:    Talks on phone: Not on file    Gets together: Not on file    Attends religious service: Not on file    Active member of club or organization: Not on file    Attends meetings of clubs or organizations: Not on file    Relationship status: Not on file  Other Topics Concern  . Not on file  Social History Narrative  . Not on file  Outpatient Encounter Medications as of 09/16/2017  Medication Sig  . conjugated estrogens (PREMARIN) vaginal cream 1 GRAM INTRAVAGINALLY EVERY NIGHT FOR TWO WEEKS,  THEN REDUCE USE TO TWICE WEEKLY  . estradiol (ESTRACE VAGINAL) 0.1 MG/GM vaginal cream Place 1 Applicatorful vaginally at bedtime. For 2 weeks,  Then 3 times weekly thereafter  . mometasone (NASONEX) 50 MCG/ACT nasal spray Place 2 sprays into the nose daily.  Marland Kitchen zolpidem (AMBIEN) 10 MG tablet Take 1 tablet (10 mg total) by mouth at bedtime as needed. for sleep  . [DISCONTINUED] estradiol (ESTRING) 2 MG vaginal ring Place 2 mg vaginally every 3 (three) months. follow package directions (Patient not taking: Reported on 09/16/2017)   No facility-administered encounter medications on file as of 09/16/2017.     Activities of Daily Living In your present state of health, do you have any difficulty performing the following activities: 09/16/2017  Hearing? N  Vision? N    Difficulty concentrating or making decisions? N  Walking or climbing stairs? N  Dressing or bathing? N  Doing errands, shopping? N  Preparing Food and eating ? N  Using the Toilet? N  In the past six months, have you accidently leaked urine? N  Do you have problems with loss of bowel control? N  Managing your Medications? N  Managing your Finances? N  Housekeeping or managing your Housekeeping? N  Some recent data might be hidden    Patient Care Team: Crecencio Mc, MD as PCP - General (Internal Medicine)    Assessment:   This is a routine wellness examination for [name]. The goal of the wellness visit is to assist the patient how to close the gaps in care and create a preventative care plan for the patient.   The roster of all physicians providing medical care to patient is listed in the Snapshot section of the chart.  Osteoporosis risk reviewed.    Safety issues reviewed; Smoke and carbon monoxide detectors in the home. No firearms in the home. Wears seatbelts when driving or riding with others. No violence in the home.  They do not have excessive sun exposure.  Discussed the need for sun protection: hats, long sleeves and the use of sunscreen if there is significant sun exposure.  Patient is alert, normal appearance, oriented to person/place/and time. Correctly identified the president of the [place] and recalls of 3/3 words.  Performs simple calculations and can read correct time from watch face. Displays appropriate judgement.  No new identified risk were noted.  No failures at ADL's or IADL's.    BMI- discussed the importance of a healthy diet, water intake and the benefits of aerobic exercise. Educational material provided.   24 hour diet recall: Regular diet  Dental- every 6 months.  Eye- Visual acuity not assessed per patient preference since they have regular follow up with the ophthalmologist.  Wears corrective lenses.  Health maintenance gaps-  closed.  Patient Concerns: None at this time. Follow up with PCP as needed.  Exercise Activities and Dietary recommendations    Goals    . Maintain Healthy Lifestyle       Fall Risk Fall Risk  09/16/2017 04/02/2017 01/30/2016 08/11/2014 07/07/2013  Falls in the past year? No No No No No   Depression Screen PHQ 2/9 Scores 09/16/2017 04/02/2017 01/30/2016 08/11/2014  PHQ - 2 Score 0 0 0 0  PHQ- 9 Score - 0 - -     Cognitive Function MMSE - Mini Mental State Exam 09/16/2017  Orientation to  time 5  Orientation to Place 5  Registration 3  Attention/ Calculation 5  Recall 3  Language- name 2 objects 2  Language- repeat 1  Language- follow 3 step command 3  Language- read & follow direction 1  Write a sentence 1  Copy design 1  Total score 30        Immunization History  Administered Date(s) Administered  . Influenza Split 01/10/2012, 12/16/2013  . Influenza, High Dose Seasonal PF 01/10/2017  . Influenza, Seasonal, Injecte, Preservative Fre 11/25/2014  . Influenza,inj,Quad PF,6+ Mos 12/10/2012  . Influenza-Unspecified 01/18/2016  . Pneumococcal Conjugate-13 07/07/2013  . Pneumococcal Polysaccharide-23 01/10/2017  . Tdap 10/11/2011  . Zoster 01/26/2014   Screening Tests Health Maintenance  Topic Date Due  . INFLUENZA VACCINE  10/16/2017  . MAMMOGRAM  03/04/2018  . Fecal DNA (Cologuard)  08/06/2020  . TETANUS/TDAP  10/10/2021  . DEXA SCAN  Completed  . Hepatitis C Screening  Completed  . PNA vac Low Risk Adult  Completed      Plan:    End of life planning; Advance aging; Advanced directives discussed. Copy of current HCPOA/Living Will requested.    I have personally reviewed and noted the following in the patient's chart:   . Medical and social history . Use of alcohol, tobacco or illicit drugs  . Current medications and supplements . Functional ability and status . Nutritional status . Physical activity . Advanced directives . List of other  physicians . Hospitalizations, surgeries, and ER visits in previous 12 months . Vitals . Screenings to include cognitive, depression, and falls . Referrals and appointments  In addition, I have reviewed and discussed with patient certain preventive protocols, quality metrics, and best practice recommendations. A written personalized care plan for preventive services as well as general preventive health recommendations were provided to patient.     Varney Biles, LPN  08/16/5377   Reviewed above information.  Agree with assessment and plan.    Dr Nicki Reaper

## 2017-10-13 DIAGNOSIS — M5134 Other intervertebral disc degeneration, thoracic region: Secondary | ICD-10-CM | POA: Diagnosis not present

## 2017-10-13 DIAGNOSIS — M62830 Muscle spasm of back: Secondary | ICD-10-CM | POA: Diagnosis not present

## 2017-10-13 DIAGNOSIS — M9901 Segmental and somatic dysfunction of cervical region: Secondary | ICD-10-CM | POA: Diagnosis not present

## 2017-10-13 DIAGNOSIS — M9902 Segmental and somatic dysfunction of thoracic region: Secondary | ICD-10-CM | POA: Diagnosis not present

## 2017-11-10 DIAGNOSIS — M9902 Segmental and somatic dysfunction of thoracic region: Secondary | ICD-10-CM | POA: Diagnosis not present

## 2017-11-10 DIAGNOSIS — M62830 Muscle spasm of back: Secondary | ICD-10-CM | POA: Diagnosis not present

## 2017-11-10 DIAGNOSIS — M5134 Other intervertebral disc degeneration, thoracic region: Secondary | ICD-10-CM | POA: Diagnosis not present

## 2017-11-10 DIAGNOSIS — M9901 Segmental and somatic dysfunction of cervical region: Secondary | ICD-10-CM | POA: Diagnosis not present

## 2017-12-04 ENCOUNTER — Other Ambulatory Visit: Payer: Self-pay | Admitting: Internal Medicine

## 2017-12-05 NOTE — Telephone Encounter (Signed)
Refilled: 04/02/2017 Last OV: 04/02/2017 Next OV: 09/21/2018

## 2017-12-08 DIAGNOSIS — M5134 Other intervertebral disc degeneration, thoracic region: Secondary | ICD-10-CM | POA: Diagnosis not present

## 2017-12-08 DIAGNOSIS — M9902 Segmental and somatic dysfunction of thoracic region: Secondary | ICD-10-CM | POA: Diagnosis not present

## 2017-12-08 DIAGNOSIS — M62830 Muscle spasm of back: Secondary | ICD-10-CM | POA: Diagnosis not present

## 2017-12-08 DIAGNOSIS — M9901 Segmental and somatic dysfunction of cervical region: Secondary | ICD-10-CM | POA: Diagnosis not present

## 2017-12-09 ENCOUNTER — Ambulatory Visit (INDEPENDENT_AMBULATORY_CARE_PROVIDER_SITE_OTHER): Payer: Medicare Other | Admitting: Internal Medicine

## 2017-12-09 ENCOUNTER — Ambulatory Visit: Payer: Self-pay | Admitting: Internal Medicine

## 2017-12-09 ENCOUNTER — Encounter: Payer: Self-pay | Admitting: Internal Medicine

## 2017-12-09 VITALS — BP 122/76 | HR 65 | Temp 98.1°F | Resp 15 | Ht 67.0 in | Wt 166.4 lb

## 2017-12-09 DIAGNOSIS — R6889 Other general symptoms and signs: Secondary | ICD-10-CM

## 2017-12-09 DIAGNOSIS — R5383 Other fatigue: Secondary | ICD-10-CM

## 2017-12-09 DIAGNOSIS — J069 Acute upper respiratory infection, unspecified: Secondary | ICD-10-CM

## 2017-12-09 DIAGNOSIS — B9789 Other viral agents as the cause of diseases classified elsewhere: Secondary | ICD-10-CM | POA: Diagnosis not present

## 2017-12-09 MED ORDER — PREDNISONE 10 MG PO TABS: ORAL_TABLET | ORAL | 0 refills | Status: DC

## 2017-12-09 NOTE — Telephone Encounter (Signed)
I returned her call.   She was on a cruise with her husband and was exposed to the flu from a passenger that was sick with flu around Sept. 5-7th.    Her husband became sick and was diagnosed with the flu on Sept. 17th.    The pt said she started feeling bad while on the cruise with chills and coughing.   She then got better.   Now the symptoms are returning and she is having a non productive cough and feeling very tired.   Does not feel as though she is running fever at this point.   She is also having nausea without vomiting but diminished appetite.     I scheduled her with Dr. Derrel Nip for today at 4:30.     Reason for Disposition . [1] Fever returns after gone for over 24 hours AND [2] symptoms worse or not improved    Symptoms came on the got better and now have returned.   Husband diagnosed with flu.  Answer Assessment - Initial Assessment Questions 1. TYPE of EXPOSURE: "How were you exposed?" (e.g., close contact, not a close contact)    Was on a cruise.   I had chills and coughing.   Then my husband got diagnosed with flu 9/17.    I got better but now I'm coughing again and feeling tired and nausea.  Do I need to come in?   2. DATE of EXPOSURE: "When did the exposure occur?" (e.g., hour, days, weeks)     Sept 5-7th I was exposed to someone with flu on the cruise.   I started feeling bad about the 12th.    Plus my husband was diagnosed with flu. 3. PREGNANCY: "Is there any chance you are pregnant?" "When was your last menstrual period?"     Not asked due to age. 4. HIGH RISK for COMPLICATIONS: "Do you have any heart or lung problems? Do you have a weakened immune system?" (e.g., CHF, COPD, asthma, HIV positive, chemotherapy, renal failure, diabetes mellitus, sickle cell anemia)     None of the above.   I'm very healthy. 5. SYMPTOMS: "Do you have any symptoms?" (e.g., cough, fever, sore throat, difficulty breathing).     Coughing non  Productive, no sore throat, no fever.  Protocols used:  INFLUENZA - SEASONAL-A-AH, INFLUENZA EXPOSURE-A-AH

## 2017-12-09 NOTE — Patient Instructions (Addendum)
I think your symptoms are due to a non influenza viral illness that was aggravated by your pollen exposure when you worked outside.  I will let you know of hte rapid flu test results  either way  Your cough is due to post nasal drip.   For your post nasal drip, I recommend taking Benadryl at bedtime  And add one of the newer second generation antihistamines that are longer acting, non sedating and  available OTC:  Generic  Zyrtec, which is cetirizine.    generic Allegra , available generically as fexofenadine ; comes in 60 mg and 180 mg once daily strengths.    Generic Claritin :  also available as loratidine .  Robutissin and Delsym are the best OTC cough meds   You can start the prednisone taper and feel great in 48 hours,  Or let nature take her slow course (another 4 or 5 days

## 2017-12-09 NOTE — Progress Notes (Signed)
Subjective:  Patient ID: Christina Hammond, female    DOB: 1951-03-16  Age: 67 y.o. MRN: [id number]  CC: The primary encounter diagnosis was Flu-like symptoms. Diagnoses of Fatigue, unspecified type and Viral URI with cough were also pertinent to this visit.  HPI SADEY YANDELL presents for flu like illness,  Symptoms  Began ten days ago while vacationing she woke up with nausea and fatigue accompanied by nonproductive  cough without fever .  Her husbnad was diagnosed with influenza one week ago .  Her symptoms improved gradually and she felt well enough to work in the yard last Thursday and by nightfall she was congested,  Sneezing  And having rhinorrhea   Outpatient Medications Prior to Visit  Medication Sig Dispense Refill  . mometasone (NASONEX) 50 MCG/ACT nasal spray Place 2 sprays into the nose daily. 17 g 12  . zolpidem (AMBIEN) 10 MG tablet TAKE ONE TABLET BY MOUTH AT BEDTIME AS NEEDED FOR SLEEP 30 tablet 3  . conjugated estrogens (PREMARIN) vaginal cream 1 GRAM INTRAVAGINALLY EVERY NIGHT FOR TWO WEEKS,  THEN REDUCE USE TO TWICE WEEKLY 42.5 g 12  . estradiol (ESTRACE VAGINAL) 0.1 MG/GM vaginal cream Place 1 Applicatorful vaginally at bedtime. For 2 weeks,  Then 3 times weekly thereafter 42.5 g 12   No facility-administered medications prior to visit.     Review of Systems;  Patient denies headache, fevers, malaise, unintentional weight loss, skin rash, eye pain, sinus congestion and sinus pain, sore throat, dysphagia,  hemoptysis ,  dyspnea, wheezing, chest pain, palpitations, orthopnea, edema, abdominal pain, nausea, melena, diarrhea, constipation, flank pain, dysuria, hematuria, urinary  Frequency, nocturia, numbness, tingling, seizures,  Focal weakness, Loss of consciousness,  Tremor, insomnia, depression, anxiety, and suicidal ideation.      Objective:  BP 122/76 (BP Location: Left Arm, Patient Position: Sitting, Cuff Size: Normal)   Pulse 65   Temp 98.1 F (36.7 C) (Oral)    Resp 15   Ht 5\' 7"  (1.702 m)   Wt 166 lb 6.4 oz (75.5 kg)   SpO2 97%   BMI 26.06 kg/m   BP Readings from Last 3 Encounters:  12/09/17 122/76  09/16/17 128/70  04/02/17 114/76    Wt Readings from Last 3 Encounters:  12/09/17 166 lb 6.4 oz (75.5 kg)  09/16/17 167 lb 12.8 oz (76.1 kg)  04/02/17 166 lb 9.6 oz (75.6 kg)    General appearance: alert, cooperative and appears stated age Ears: normal TM's and external ear canals both ears Throat: lips, mucosa, and tongue normal; teeth and gums normal Neck: no adenopathy, no carotid bruit, supple, symmetrical, trachea midline and thyroid not enlarged, symmetric, no tenderness/mass/nodules Back: symmetric, no curvature. ROM normal. No CVA tenderness. Lungs: clear to auscultation bilaterally Heart: regular rate and rhythm, S1, S2 normal, no murmur, click, rub or gallop Abdomen: soft, non-tender; bowel sounds normal; no masses,  no organomegaly Pulses: 2+ and symmetric Skin: Skin color, texture, turgor normal. No rashes or lesions Lymph nodes: Cervical, supraclavicular, and axillary nodes normal.  No results found for: HGBA1C  Lab Results  Component Value Date   CREATININE 0.78 04/02/2017   CREATININE 0.63 02/26/2016   CREATININE 0.79 09/01/2014    Lab Results  Component Value Date   WBC 5.3 12/09/2017   HGB 13.6 12/09/2017   HCT 40.5 12/09/2017   PLT 292.0 12/09/2017   GLUCOSE 105 (H) 04/02/2017   CHOL 199 04/02/2017   TRIG 94.0 04/02/2017   HDL 50.10 04/02/2017   [place]  130 (H) 04/02/2017   ALT 19 04/02/2017   AST 16 04/02/2017   NA 139 04/02/2017   K 4.8 04/02/2017   CL 102 04/02/2017   CREATININE 0.78 04/02/2017   BUN 15 04/02/2017   CO2 30 04/02/2017   TSH 1.42 04/02/2017    Mm Screening Breast Tomo Bilateral  Result Date: 03/05/2017 CLINICAL DATA:  Screening. EXAM: 2D DIGITAL SCREENING BILATERAL MAMMOGRAM WITH CAD AND ADJUNCT TOMO COMPARISON:  Previous exam(s). ACR Breast Density Category b: There are  scattered areas of fibroglandular density. FINDINGS: There are no findings suspicious for malignancy. Images were processed with CAD. IMPRESSION: No mammographic evidence of malignancy. A result letter of this screening mammogram will be mailed directly to the patient. RECOMMENDATION: Screening mammogram in one year. (Code:SM-B-01Y) BI-RADS CATEGORY  1: Negative. Electronically Signed   By: Ammie Ferrier M.D.   On: 03/05/2017 09:42    Assessment & Plan:   Problem List Items Addressed This Visit    Fatigue   Relevant Orders   CBC with Differential/Platelet (Completed)   Viral URI with cough    Complicated by seasonal allergic rhinitis triggered by pollen exposure. Rapid flu test done to confirm that she did not have influenza A.  Supportive care outlined and given         Other Visit Diagnoses    Flu-like symptoms    -  Primary   Relevant Orders   POCT Influenza A/B     I have discontinued [name] L. Markowicz's estradiol. I have also changed her conjugated estrogens. Additionally, I am having her start on predniSONE. Lastly, I am having her maintain her mometasone and zolpidem.  Meds ordered this encounter  Medications  . predniSONE (DELTASONE) 10 MG tablet    Sig: 6 tablets on Day 1 , then reduce by 1 tablet daily until gone    Dispense:  21 tablet    Refill:  0  . conjugated estrogens (PREMARIN) vaginal cream    Sig: 1 GRAM INTRAVAGINALLY  TWICE WEEKLY    Dispense:  42.5 g    Refill:  12    Replaces prior instructions    Medications Discontinued During This Encounter  Medication Reason  . conjugated estrogens (PREMARIN) vaginal cream   . estradiol (ESTRACE VAGINAL) 0.1 MG/GM vaginal cream     Follow-up: No follow-ups on file.   Crecencio Mc, MD

## 2017-12-10 DIAGNOSIS — J069 Acute upper respiratory infection, unspecified: Secondary | ICD-10-CM | POA: Insufficient documentation

## 2017-12-10 DIAGNOSIS — B9789 Other viral agents as the cause of diseases classified elsewhere: Secondary | ICD-10-CM

## 2017-12-10 LAB — CBC WITH DIFFERENTIAL/PLATELET
BASOS PCT: 0.9 % (ref 0.0–3.0)
Basophils Absolute: 0 10*3/uL (ref 0.0–0.1)
EOS ABS: 0.1 10*3/uL (ref 0.0–0.7)
Eosinophils Relative: 1.5 % (ref 0.0–5.0)
HCT: 40.5 % (ref 36.0–46.0)
HEMOGLOBIN: 13.6 g/dL (ref 12.0–15.0)
Lymphocytes Relative: 35.6 % (ref 12.0–46.0)
Lymphs Abs: 1.9 10*3/uL (ref 0.7–4.0)
MCHC: 33.7 g/dL (ref 30.0–36.0)
MCV: 88.8 fl (ref 78.0–100.0)
MONO ABS: 0.4 10*3/uL (ref 0.1–1.0)
Monocytes Relative: 8.1 % (ref 3.0–12.0)
Neutro Abs: 2.9 10*3/uL (ref 1.4–7.7)
Neutrophils Relative %: 53.9 % (ref 43.0–77.0)
PLATELETS: 292 10*3/uL (ref 150.0–400.0)
RBC: 4.56 Mil/uL (ref 3.87–5.11)
RDW: 12.7 % (ref 11.5–15.5)
WBC: 5.3 10*3/uL (ref 4.0–10.5)

## 2017-12-10 LAB — POCT INFLUENZA A/B
INFLUENZA A, POC: NEGATIVE
Influenza B, POC: NEGATIVE

## 2017-12-10 MED ORDER — ESTROGENS, CONJUGATED 0.625 MG/GM VA CREA: TOPICAL_CREAM | VAGINAL | 12 refills | Status: DC

## 2017-12-10 NOTE — Assessment & Plan Note (Signed)
Complicated by seasonal allergic rhinitis triggered by pollen exposure. Rapid flu test done to confirm that she did not have influenza A.  Supportive care outlined and given

## 2017-12-16 ENCOUNTER — Telehealth: Payer: Self-pay

## 2017-12-16 NOTE — Telephone Encounter (Signed)
Not sure how they received that because we faxed the rx to [facility] and we called them this morning to verify that they got the fax and the pharmacist stated that they did.

## 2017-12-16 NOTE — Telephone Encounter (Signed)
Copied from [place] [id number]. Topic: Inquiry >> Dec 16, 2017  8:53 AM Oliver Pila B wrote: Reason for CRM: [facility] called and states they received a script for estrogen cream but wanted to notify the physician the pt is has not been part of they're facility

## 2017-12-19 DIAGNOSIS — M9901 Segmental and somatic dysfunction of cervical region: Secondary | ICD-10-CM | POA: Diagnosis not present

## 2017-12-19 DIAGNOSIS — M62830 Muscle spasm of back: Secondary | ICD-10-CM | POA: Diagnosis not present

## 2017-12-19 DIAGNOSIS — M5134 Other intervertebral disc degeneration, thoracic region: Secondary | ICD-10-CM | POA: Diagnosis not present

## 2017-12-19 DIAGNOSIS — M9902 Segmental and somatic dysfunction of thoracic region: Secondary | ICD-10-CM | POA: Diagnosis not present

## 2017-12-19 NOTE — Telephone Encounter (Signed)
[facility] called in wanting to know if it was ok to do a 90day supply

## 2017-12-20 DIAGNOSIS — M62830 Muscle spasm of back: Secondary | ICD-10-CM | POA: Diagnosis not present

## 2017-12-20 DIAGNOSIS — M9902 Segmental and somatic dysfunction of thoracic region: Secondary | ICD-10-CM | POA: Diagnosis not present

## 2017-12-20 DIAGNOSIS — M9901 Segmental and somatic dysfunction of cervical region: Secondary | ICD-10-CM | POA: Diagnosis not present

## 2017-12-20 DIAGNOSIS — M5134 Other intervertebral disc degeneration, thoracic region: Secondary | ICD-10-CM | POA: Diagnosis not present

## 2017-12-22 DIAGNOSIS — M5134 Other intervertebral disc degeneration, thoracic region: Secondary | ICD-10-CM | POA: Diagnosis not present

## 2017-12-22 DIAGNOSIS — M9901 Segmental and somatic dysfunction of cervical region: Secondary | ICD-10-CM | POA: Diagnosis not present

## 2017-12-22 DIAGNOSIS — M62830 Muscle spasm of back: Secondary | ICD-10-CM | POA: Diagnosis not present

## 2017-12-22 DIAGNOSIS — M9902 Segmental and somatic dysfunction of thoracic region: Secondary | ICD-10-CM | POA: Diagnosis not present

## 2017-12-29 DIAGNOSIS — M62830 Muscle spasm of back: Secondary | ICD-10-CM | POA: Diagnosis not present

## 2017-12-29 DIAGNOSIS — M9902 Segmental and somatic dysfunction of thoracic region: Secondary | ICD-10-CM | POA: Diagnosis not present

## 2017-12-29 DIAGNOSIS — M5134 Other intervertebral disc degeneration, thoracic region: Secondary | ICD-10-CM | POA: Diagnosis not present

## 2017-12-29 DIAGNOSIS — M9901 Segmental and somatic dysfunction of cervical region: Secondary | ICD-10-CM | POA: Diagnosis not present

## 2018-01-08 DIAGNOSIS — H2513 Age-related nuclear cataract, bilateral: Secondary | ICD-10-CM | POA: Diagnosis not present

## 2018-01-12 DIAGNOSIS — M9901 Segmental and somatic dysfunction of cervical region: Secondary | ICD-10-CM | POA: Diagnosis not present

## 2018-01-12 DIAGNOSIS — M5134 Other intervertebral disc degeneration, thoracic region: Secondary | ICD-10-CM | POA: Diagnosis not present

## 2018-01-12 DIAGNOSIS — M9902 Segmental and somatic dysfunction of thoracic region: Secondary | ICD-10-CM | POA: Diagnosis not present

## 2018-01-12 DIAGNOSIS — Z23 Encounter for immunization: Secondary | ICD-10-CM | POA: Diagnosis not present

## 2018-01-12 DIAGNOSIS — M62830 Muscle spasm of back: Secondary | ICD-10-CM | POA: Diagnosis not present

## 2018-01-20 DIAGNOSIS — H6123 Impacted cerumen, bilateral: Secondary | ICD-10-CM | POA: Diagnosis not present

## 2018-01-20 DIAGNOSIS — R42 Dizziness and giddiness: Secondary | ICD-10-CM | POA: Diagnosis not present

## 2018-02-09 DIAGNOSIS — M9901 Segmental and somatic dysfunction of cervical region: Secondary | ICD-10-CM | POA: Diagnosis not present

## 2018-02-09 DIAGNOSIS — M5134 Other intervertebral disc degeneration, thoracic region: Secondary | ICD-10-CM | POA: Diagnosis not present

## 2018-02-09 DIAGNOSIS — M9902 Segmental and somatic dysfunction of thoracic region: Secondary | ICD-10-CM | POA: Diagnosis not present

## 2018-02-09 DIAGNOSIS — M62830 Muscle spasm of back: Secondary | ICD-10-CM | POA: Diagnosis not present

## 2018-02-19 DIAGNOSIS — N951 Menopausal and female climacteric states: Secondary | ICD-10-CM | POA: Diagnosis not present

## 2018-03-04 DIAGNOSIS — R232 Flushing: Secondary | ICD-10-CM | POA: Diagnosis not present

## 2018-03-04 DIAGNOSIS — N898 Other specified noninflammatory disorders of vagina: Secondary | ICD-10-CM | POA: Diagnosis not present

## 2018-03-04 DIAGNOSIS — R6882 Decreased libido: Secondary | ICD-10-CM | POA: Diagnosis not present

## 2018-03-04 DIAGNOSIS — G479 Sleep disorder, unspecified: Secondary | ICD-10-CM | POA: Diagnosis not present

## 2018-03-04 DIAGNOSIS — N951 Menopausal and female climacteric states: Secondary | ICD-10-CM | POA: Diagnosis not present

## 2018-03-09 DIAGNOSIS — M5134 Other intervertebral disc degeneration, thoracic region: Secondary | ICD-10-CM | POA: Diagnosis not present

## 2018-03-09 DIAGNOSIS — M9902 Segmental and somatic dysfunction of thoracic region: Secondary | ICD-10-CM | POA: Diagnosis not present

## 2018-03-09 DIAGNOSIS — M62830 Muscle spasm of back: Secondary | ICD-10-CM | POA: Diagnosis not present

## 2018-03-09 DIAGNOSIS — M9901 Segmental and somatic dysfunction of cervical region: Secondary | ICD-10-CM | POA: Diagnosis not present

## 2018-03-16 ENCOUNTER — Other Ambulatory Visit: Payer: Self-pay | Admitting: Internal Medicine

## 2018-03-16 DIAGNOSIS — Z1231 Encounter for screening mammogram for malignant neoplasm of breast: Secondary | ICD-10-CM

## 2018-03-19 ENCOUNTER — Ambulatory Visit
Admission: RE | Admit: 2018-03-19 | Discharge: 2018-03-19 | Disposition: A | Discharge status: Home / Self Care | Payer: Medicare Other | Source: Ambulatory Visit | Attending: Internal Medicine | Admitting: Internal Medicine

## 2018-03-19 DIAGNOSIS — Z1231 Encounter for screening mammogram for malignant neoplasm of breast: Secondary | ICD-10-CM | POA: Diagnosis not present

## 2018-03-31 DIAGNOSIS — G479 Sleep disorder, unspecified: Secondary | ICD-10-CM | POA: Diagnosis not present

## 2018-03-31 DIAGNOSIS — R232 Flushing: Secondary | ICD-10-CM | POA: Diagnosis not present

## 2018-03-31 DIAGNOSIS — N951 Menopausal and female climacteric states: Secondary | ICD-10-CM | POA: Diagnosis not present

## 2018-03-31 DIAGNOSIS — R6882 Decreased libido: Secondary | ICD-10-CM | POA: Diagnosis not present

## 2018-04-06 DIAGNOSIS — M9901 Segmental and somatic dysfunction of cervical region: Secondary | ICD-10-CM | POA: Diagnosis not present

## 2018-04-06 DIAGNOSIS — M9902 Segmental and somatic dysfunction of thoracic region: Secondary | ICD-10-CM | POA: Diagnosis not present

## 2018-04-06 DIAGNOSIS — M5134 Other intervertebral disc degeneration, thoracic region: Secondary | ICD-10-CM | POA: Diagnosis not present

## 2018-04-06 DIAGNOSIS — M62830 Muscle spasm of back: Secondary | ICD-10-CM | POA: Diagnosis not present

## 2018-04-08 DIAGNOSIS — N951 Menopausal and female climacteric states: Secondary | ICD-10-CM | POA: Diagnosis not present

## 2018-04-08 DIAGNOSIS — R6882 Decreased libido: Secondary | ICD-10-CM | POA: Diagnosis not present

## 2018-04-08 DIAGNOSIS — G479 Sleep disorder, unspecified: Secondary | ICD-10-CM | POA: Diagnosis not present

## 2018-04-08 DIAGNOSIS — R232 Flushing: Secondary | ICD-10-CM | POA: Diagnosis not present

## 2018-05-04 DIAGNOSIS — M9901 Segmental and somatic dysfunction of cervical region: Secondary | ICD-10-CM | POA: Diagnosis not present

## 2018-05-04 DIAGNOSIS — M62830 Muscle spasm of back: Secondary | ICD-10-CM | POA: Diagnosis not present

## 2018-05-04 DIAGNOSIS — M5134 Other intervertebral disc degeneration, thoracic region: Secondary | ICD-10-CM | POA: Diagnosis not present

## 2018-05-04 DIAGNOSIS — M9902 Segmental and somatic dysfunction of thoracic region: Secondary | ICD-10-CM | POA: Diagnosis not present

## 2018-06-01 DIAGNOSIS — M9901 Segmental and somatic dysfunction of cervical region: Secondary | ICD-10-CM | POA: Diagnosis not present

## 2018-06-01 DIAGNOSIS — M9902 Segmental and somatic dysfunction of thoracic region: Secondary | ICD-10-CM | POA: Diagnosis not present

## 2018-06-01 DIAGNOSIS — M5134 Other intervertebral disc degeneration, thoracic region: Secondary | ICD-10-CM | POA: Diagnosis not present

## 2018-06-01 DIAGNOSIS — M62830 Muscle spasm of back: Secondary | ICD-10-CM | POA: Diagnosis not present

## 2018-06-08 ENCOUNTER — Other Ambulatory Visit: Payer: Self-pay | Admitting: Internal Medicine

## 2018-06-08 NOTE — Telephone Encounter (Signed)
Refilled: 12/05/2017 Last OV: 12/09/2017 Next OV: 09/21/2018

## 2018-08-03 DIAGNOSIS — M62830 Muscle spasm of back: Secondary | ICD-10-CM | POA: Diagnosis not present

## 2018-08-03 DIAGNOSIS — M5134 Other intervertebral disc degeneration, thoracic region: Secondary | ICD-10-CM | POA: Diagnosis not present

## 2018-08-03 DIAGNOSIS — M9902 Segmental and somatic dysfunction of thoracic region: Secondary | ICD-10-CM | POA: Diagnosis not present

## 2018-08-03 DIAGNOSIS — M9901 Segmental and somatic dysfunction of cervical region: Secondary | ICD-10-CM | POA: Diagnosis not present

## 2018-09-01 DIAGNOSIS — M9901 Segmental and somatic dysfunction of cervical region: Secondary | ICD-10-CM | POA: Diagnosis not present

## 2018-09-01 DIAGNOSIS — M9902 Segmental and somatic dysfunction of thoracic region: Secondary | ICD-10-CM | POA: Diagnosis not present

## 2018-09-01 DIAGNOSIS — M5134 Other intervertebral disc degeneration, thoracic region: Secondary | ICD-10-CM | POA: Diagnosis not present

## 2018-09-01 DIAGNOSIS — M62830 Muscle spasm of back: Secondary | ICD-10-CM | POA: Diagnosis not present

## 2018-09-21 ENCOUNTER — Ambulatory Visit (INDEPENDENT_AMBULATORY_CARE_PROVIDER_SITE_OTHER): Payer: Medicare Other

## 2018-09-21 ENCOUNTER — Ambulatory Visit: Payer: Medicare Other | Admitting: Internal Medicine

## 2018-09-21 ENCOUNTER — Other Ambulatory Visit: Payer: Self-pay

## 2018-09-21 DIAGNOSIS — Z0000 Encounter for general adult medical examination without abnormal findings: Secondary | ICD-10-CM

## 2018-09-21 NOTE — Patient Instructions (Addendum)
  Christina Hammond , Thank you for taking time to come for your Medicare Wellness Visit. I appreciate your ongoing commitment to your health goals. Please review the following plan we discussed and let me know if I can assist you in the future.   These are the goals we discussed: Goals      Patient Stated   . Increase physical activity (pt-stated)     Use home gym equipment 5-6 days weekly, 30 minutes.  Walk/count steps to 5000-6000 daily.       This is a list of the screening recommended for you and due dates:  Health Maintenance  Topic Date Due  . Flu Shot  10/17/2018  . Mammogram  03/20/2019  . Cologuard (Stool DNA test)  08/06/2020  . Tetanus Vaccine  10/10/2021  . DEXA scan (bone density measurement)  Completed  .  Hepatitis C: One time screening is recommended by Center for Disease Control  (CDC) for  adults born from [birth year] through 1967.   Completed  . Pneumonia vaccines  Completed

## 2018-09-21 NOTE — Progress Notes (Addendum)
Subjective:   Christina Hammond is a 68 y.o. female who presents for Medicare Annual (Subsequent) preventive examination.  Review of Systems:  No ROS.  Medicare Wellness Virtual Visit.  Visual/audio telehealth visit, UTA vital signs.   See social history for additional risk factors.   Cardiac Risk Factors include: advanced age (>38men, >37 women)     Objective:     Vitals: There were no vitals taken for this visit.  There is no height or weight on file to calculate BMI.  Advanced Directives 09/21/2018 09/16/2017  Does Patient Have a Medical Advance Directive? Yes Yes  Type of Paramedic of [place];Living will [place]  Does patient want to make changes to medical advance directive? No - Patient declined No - Patient declined  Copy of [place] in Chart? No - copy requested -    Tobacco Social History   Tobacco Use  Smoking Status Never Smoker  Smokeless Tobacco Never Used     Counseling given: Not Answered   Clinical Intake:  Pre-visit preparation completed: Yes        Diabetes: No  How often do you need to have someone help you when you read instructions, pamphlets, or other written materials from your doctor or pharmacy?: 1 - Never  Interpreter Needed?: No     Past Medical History:  Diagnosis Date  . Cancer ([place])    basal cell,  [facility]   . Restless legs syndrome (RLS)    managed with minimal meds   Past Surgical History:  Procedure Laterality Date  . ABDOMINAL HYSTERECTOMY  1979  . APPENDECTOMY  1956  . CESAREAN SECTION    . CHOLECYSTECTOMY     Family History  Problem Relation Age of Onset  . Diabetes Mother   . Stroke Mother   . Cancer Father   . Heart disease Father   . Cancer Maternal Aunt   . Mental retardation Maternal Aunt   . Alcohol abuse Maternal Uncle    Social History   Socioeconomic History  . Marital status: Married    Spouse name: Not on file  . Number of  children: Not on file  . Years of education: Not on file  . Highest education level: Not on file  Occupational History  . Occupation: real Conservation officer, historic buildings  Social Needs  . Financial resource strain: Not hard at all  . Food insecurity    Worry: Never true    Inability: Never true  . Transportation needs    Medical: No    Non-medical: No  Tobacco Use  . Smoking status: Never Smoker  . Smokeless tobacco: Never Used  Substance and Sexual Activity  . Alcohol use: Yes  . Drug use: No  . Sexual activity: Not on file  Lifestyle  . Physical activity    Days per week: 0 days    Minutes per session: Not on file  . Stress: Not at all  Relationships  . Social Herbalist on phone: Not on file    Gets together: Not on file    Attends religious service: Not on file    Active member of club or organization: Not on file    Attends meetings of clubs or organizations: Not on file    Relationship status: Not on file  Other Topics Concern  . Not on file  Social History Narrative  . Not on file    Outpatient Encounter Medications as of 09/21/2018  Medication Sig  . conjugated estrogens (PREMARIN) vaginal cream 1 GRAM INTRAVAGINALLY  TWICE WEEKLY  . mometasone (NASONEX) 50 MCG/ACT nasal spray Place 2 sprays into the nose daily.  Marland Kitchen zolpidem (AMBIEN) 10 MG tablet TAKE ONE TABLET BY MOUTH AT BEDTIME AS NEEDED FOR SLEEP  . [DISCONTINUED] predniSONE (DELTASONE) 10 MG tablet 6 tablets on Day 1 , then reduce by 1 tablet daily until gone   No facility-administered encounter medications on file as of 09/21/2018.     Activities of Daily Living In your present state of health, do you have any difficulty performing the following activities: 09/21/2018  Hearing? N  Vision? N  Difficulty concentrating or making decisions? N  Walking or climbing stairs? N  Dressing or bathing? N  Doing errands, shopping? N  Preparing Food and eating ? N  Using the Toilet? N  In the past six months, have you  accidently leaked urine? N  Do you have problems with loss of bowel control? N  Managing your Medications? N  Managing your Finances? N  Housekeeping or managing your Housekeeping? N  Some recent data might be hidden    Patient Care Team: Crecencio Mc, MD as PCP - General (Internal Medicine)    Assessment:   This is a routine wellness examination for Christina Hammond.  I connected with patient 09/21/18 at  9:30 AM EDT by a video/audio enabled telemedicine application and verified that I am speaking with the correct person using two identifiers. Patient stated full name and DOB. Patient gave permission to continue with virtual visit. Patient's location was at home and Nurse's location was at [place] office.   Patient reports continued difficulty sleeping. Notes ambien not working. Tried blue sky natural hormone replacement with poor result. Now taking melatonin 10 mg. Deferred to discuss further with her doctor in upcoming appointment later this week.   She plans to increase protein intake with premier protein as a snack at night to avoid waking to eat.   Health Screenings  Mammogram - 03/2018 Colonoscopy - 09/2006 Cologuard- 07/2017 Bone Density - 01/2010 Glaucoma -none Hearing -demonstrates normal hearing during visit. TSH- 03/2017 Cholesterol - 03/2017 Dental- visit every 6 months Vision- visits within the last 12 months.  Social  Alcohol intake - yes      Smoking history- never    Smokers in home? none Illicit drug use? none Physical activity- home gym 2-3 days weekly, 30 min and 3500 steps  Diet - healthy Sexually Active -not currently BMI- discussed the importance of a healthy diet, water intake and the benefits of aerobic exercise.  Educational material provided.   Safety  Patient feels safe at home- yes Patient does have smoke detectors at home- yes Patient does wear sunscreen or protective clothing when in direct sunlight -yes Patient does wear seat belt when in a moving  vehicle -yes  Covid-19 precautions and sickness symptoms discussed.   Activities of Daily Living Patient denies needing assistance with: driving, household chores, feeding themselves, getting from bed to chair, getting to the toilet, bathing/showering, dressing, managing money, or preparing meals.  No new identified risk were noted.    Depression Screen Patient denies losing interest in daily life, feeling hopeless, or crying easily over simple problems.   Medication-taking as directed and without issues.   Fall Screen Patient denies being afraid of falling or falling in the last year.   Memory Screen Patient is alert.  Patient denies difficulty focusing, concentrating or misplacing items. Correctly identified the president of  the [place] ,season and recall. Patient likes to read for brain stimulation.  Immunizations The following Immunizations were discussed: Influenza, shingles, pneumonia, and tetanus.   Other Providers Patient Care Team: Crecencio Mc, MD as PCP - General (Internal Medicine)  Exercise Activities and Dietary recommendations Current Exercise Habits: Home exercise routine, Type of exercise: strength training/weights;treadmill;calisthenics, Time (Minutes): 30, Frequency (Times/Week): 3, Weekly Exercise (Minutes/Week): 90, Intensity: Mild  Goals      Patient Stated   . Increase physical activity (pt-stated)     Use home gym equipment 5-6 days weekly, 30 minutes.  Walk/count steps to 5000-6000 daily.       Fall Risk Fall Risk  09/21/2018 09/16/2017 04/02/2017 01/30/2016 08/11/2014  Falls in the past year? 0 No No No No   Depression Screen PHQ 2/9 Scores 09/21/2018 09/16/2017 04/02/2017 01/30/2016  PHQ - 2 Score 0 0 0 0  PHQ- 9 Score - - 0 -     Cognitive Function MMSE - Mini Mental State Exam 09/16/2017  Orientation to time 5  Orientation to Place 5  Registration 3  Attention/ Calculation 5  Recall 3  Language- name 2 objects 2  Language- repeat 1   Language- follow 3 step command 3  Language- read & follow direction 1  Write a sentence 1  Copy design 1  Total score 30     6CIT Screen 09/21/2018  What Year? 0 points  What month? 0 points  What time? 0 points  Count back from 20 0 points  Months in reverse 0 points  Repeat phrase 0 points  Total Score 0    Immunization History  Administered Date(s) Administered  . Influenza Split 01/10/2012, 12/16/2013  . Influenza, High Dose Seasonal PF 01/10/2017  . Influenza, Seasonal, Injecte, Preservative Fre 11/25/2014  . Influenza,inj,Quad PF,6+ Mos 12/10/2012  . Influenza-Unspecified 01/18/2016, 01/12/2018  . Pneumococcal Conjugate-13 07/07/2013  . Pneumococcal Polysaccharide-23 01/10/2017  . Tdap 10/11/2011  . Zoster 01/26/2014   Screening Tests Health Maintenance  Topic Date Due  . INFLUENZA VACCINE  10/17/2018  . MAMMOGRAM  03/20/2019  . Fecal DNA (Cologuard)  08/06/2020  . TETANUS/TDAP  10/10/2021  . DEXA SCAN  Completed  . Hepatitis C Screening  Completed  . PNA vac Low Risk Adult  Completed      Plan:    End of life planning; Advance aging; Advanced directives discussed.  Copy of current HCPOA/Living Will requested.    I have personally reviewed and noted the following in the patient's chart:   . Medical and social history . Use of alcohol, tobacco or illicit drugs  . Current medications and supplements . Functional ability and status . Nutritional status . Physical activity . Advanced directives . List of other physicians . Hospitalizations, surgeries, and ER visits in previous 12 months . Vitals . Screenings to include cognitive, depression, and falls . Referrals and appointments  In addition, I have reviewed and discussed with patient certain preventive protocols, quality metrics, and best practice recommendations. A written personalized care plan for preventive services as well as general preventive health recommendations were provided to patient.      OBrien-Blaney, Cresencio Reesor L, LPN  09/16/6201    I have reviewed the above information and agree with above.   Deborra Medina, MD

## 2018-09-23 ENCOUNTER — Ambulatory Visit (INDEPENDENT_AMBULATORY_CARE_PROVIDER_SITE_OTHER): Payer: Medicare Other | Admitting: Internal Medicine

## 2018-09-23 ENCOUNTER — Other Ambulatory Visit: Payer: Self-pay

## 2018-09-23 ENCOUNTER — Other Ambulatory Visit: Payer: Medicare Other

## 2018-09-23 ENCOUNTER — Encounter: Payer: Self-pay | Admitting: Internal Medicine

## 2018-09-23 ENCOUNTER — Telehealth: Payer: Self-pay | Admitting: Internal Medicine

## 2018-09-23 DIAGNOSIS — Z7189 Other specified counseling: Secondary | ICD-10-CM

## 2018-09-23 DIAGNOSIS — F5101 Primary insomnia: Secondary | ICD-10-CM | POA: Diagnosis not present

## 2018-09-23 DIAGNOSIS — R195 Other fecal abnormalities: Secondary | ICD-10-CM

## 2018-09-23 DIAGNOSIS — Z20822 Contact with and (suspected) exposure to covid-19: Secondary | ICD-10-CM

## 2018-09-23 MED ORDER — TRAZODONE HCL 50 MG PO TABS: 25.0000 mg | ORAL_TABLET | Freq: Every evening | ORAL | 3 refills | Status: DC | PRN

## 2018-09-23 NOTE — Progress Notes (Signed)
Virtual Visit via [email or web address]  This visit type was conducted due to national recommendations for restrictions regarding the COVID-19 pandemic (e.g. social distancing).  This format is felt to be most appropriate for this patient at this time.  All issues noted in this document were discussed and addressed.  No physical exam was performed (except for noted visual exam findings with Video Visits).   I connected with@ on 09/23/18 at 11:00 AM EDT by a video enabled telemedicine application  and verified that I am speaking with the correct person using two identifiers. Location patient: home Location provider: work or home office Persons participating in the virtual visit: patient, provider  I discussed the limitations, risks, security and privacy concerns of performing an evaluation and management service by telephone and the availability of in person appointments. I also discussed with the patient that there may be a patient responsible charge related to this service. The patient expressed understanding and agreed to proceed.    Reason for visit: medication refill, worsening insomnia   HPI:    68 yr old female presents for management of insomnia, but also with the following    has been having  loose stools for the last 5 days  acc'd by light cramping.  The loose stools have been Waking her up at night . She denies fevers,  Body aches and left lower quadrant pain . No recent use of antibiotics.  No contact with any COVID 19 OR  c dificile infected patients.  Not eating out.   2) Disrupted sleep:  Falls asleep ok but wakes up around 4 am every night.    Discussed trazodone   Sister died unexpectedly after succumbing to the health issues associated with bipolar disorder and substance abuse .  She is going  to [place] on Friday for the funeral   Educated patient on the newly broadened list of signs and symptoms of COVID-19 infection and ways to avoid the viral infection including washing hands frequently with  soap and water,  using hand sanitizer if unable to wash, avoiding touching face,  staying at home and limiting visitors,  and avoiding contact with people coming in and out of home.  Discussed the potential ineffectiveness of hand sanitizer if left in environments > 110 degrees (ie , the car).  Reminded patient to call office with questions/concerns.  The importance of social distancing was discussed today   ROS: See pertinent positives and negatives per HPI.  Past Medical History:  Diagnosis Date  . Cancer ([place])    basal cell,  [facility]   . Restless legs syndrome (RLS)    managed with minimal meds    Past Surgical History:  Procedure Laterality Date  . ABDOMINAL HYSTERECTOMY  1979  . APPENDECTOMY  1956  . CESAREAN SECTION    . CHOLECYSTECTOMY      Family History  Problem Relation Age of Onset  . Diabetes Mother   . Stroke Mother   . Cancer Father   . Heart disease Father   . Cancer Maternal Aunt   . Mental retardation Maternal Aunt   . Alcohol abuse Maternal Uncle     SOCIAL HX:  reports that she has never smoked. She has never used smokeless tobacco. She reports current alcohol use. She reports that she does not use drugs.   Current Outpatient Medications:  .  conjugated estrogens (PREMARIN) vaginal cream, 1 GRAM INTRAVAGINALLY  TWICE WEEKLY, Disp: 42.5 g, Rfl: 12 .  mometasone (NASONEX) 50 MCG/ACT nasal spray, Place  2 sprays into the nose daily., Disp: 17 g, Rfl: 12 .  zolpidem (AMBIEN) 10 MG tablet, TAKE ONE TABLET BY MOUTH AT BEDTIME AS NEEDED FOR SLEEP, Disp: 30 tablet, Rfl: 4 .  traZODone (DESYREL) 50 MG tablet, Take 0.5-1 tablets (25-50 mg total) by mouth at bedtime as needed for sleep., Disp: 30 tablet, Rfl: 3  EXAM:  VITALS per patient if applicable:  GENERAL: alert, oriented, appears well and in no acute distress  HEENT: atraumatic, conjunttiva clear, no obvious abnormalities on inspection of external nose and ears  NECK: normal movements of the head and  neck  LUNGS: on inspection no signs of respiratory distress, breathing rate appears normal, no obvious gross SOB, gasping or wheezing  CV: no obvious cyanosis  MS: moves all visible extremities without noticeable abnormality  PSYCH/NEURO: pleasant and cooperative, no obvious depression or anxiety, speech and thought processing grossly intact  ASSESSMENT AND PLAN:   Insomnia Chronic , historically managed with 10  Mg ambien.  Early wakening new onset,  Attributed to additional emotional triggers including the recent untimely death of her sister, and the COVID pandemic. She has no daytime symptoms of anxiety (other than the recent onset of loose stools).   Adding trazodone  Loose stools She has no infectious symptoms and no recent exposures due to increased contact and airbone precautions that she is following due to COVID 19. 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 .  Durward Fortes to try using Imodium prn   Educated About Covid-19 Virus Infection Educated patient on the newly broadened list of signs and symptoms of COVID-19 infection and ways to avoid the viral infection including washing hands frequently with soap and water,  using hand sanitizer if unable to wash, avoiding touching face,  staying at home and limiting visitors,  and avoiding contact with people coming in and out of home.  Discussed the potential ineffectiveness of hand sanitizer if left in environments > 110 degrees (ie , the car).  Reminded patient to call office with questions/concerns.  The importance of continued social distancing was discussed today . Patient was screened for the development of any unsafe behaviors or habits that may have developed as a result of the social impact of the virus , including alcohol abuse,  Domestic violence, tobacco abuse and overeating.       I discussed the assessment and  treatment plan with the patient. The patient was provided an opportunity to ask questions and all were answered. The patient agreed with the plan and demonstrated an understanding of the instructions.   The patient was advised to call back or seek an in-person evaluation if the symptoms worsen or if the condition fails to improve as anticipated.  I provided 25 minutes of non-face-to-face time during this encounter.   Crecencio Mc, MD

## 2018-09-23 NOTE — Telephone Encounter (Signed)
Dr, Derrel Nip requests COVID 19 test for symptoms. Scheduled for today.

## 2018-09-23 NOTE — Progress Notes (Signed)
Pt stated that since Saturday she has had diarrhea. Pt has not had a recent treatment with antibiotics.

## 2018-09-26 DIAGNOSIS — R195 Other fecal abnormalities: Secondary | ICD-10-CM | POA: Insufficient documentation

## 2018-09-26 DIAGNOSIS — Z7189 Other specified counseling: Secondary | ICD-10-CM | POA: Insufficient documentation

## 2018-09-26 NOTE — Assessment & Plan Note (Addendum)
Chronic , historically managed with 10  Mg ambien.  Early wakening new onset,  Attributed to additional emotional triggers including the recent untimely death of her sister, and the COVID pandemic. She has no daytime symptoms of anxiety (other than the recent onset of loose stools).   Adding trazodone

## 2018-09-26 NOTE — Assessment & Plan Note (Signed)
She has no infectious symptoms and no recent exposures due to increased contact and airbone precautions that she is following due to COVID 19. 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 .  Durward Fortes to try using Imodium prn

## 2018-09-26 NOTE — Assessment & Plan Note (Signed)
Educated patient on the newly broadened list of signs and symptoms of COVID-19 infection and ways to avoid the viral infection including washing hands frequently with soap and water,  using hand sanitizer if unable to wash, avoiding touching face,  staying at home and limiting visitors,  and avoiding contact with people coming in and out of home.  Discussed the potential ineffectiveness of hand sanitizer if left in environments > 110 degrees (ie , the car).  Reminded patient to call office with questions/concerns.  The importance of continued social distancing was discussed today . Patient was screened for the development of any unsafe behaviors or habits that may have developed as a result of the social impact of the virus , including alcohol abuse,  Domestic violence, tobacco abuse and overeating.

## 2018-09-28 DIAGNOSIS — M9901 Segmental and somatic dysfunction of cervical region: Secondary | ICD-10-CM | POA: Diagnosis not present

## 2018-09-28 DIAGNOSIS — M5134 Other intervertebral disc degeneration, thoracic region: Secondary | ICD-10-CM | POA: Diagnosis not present

## 2018-09-28 DIAGNOSIS — M62830 Muscle spasm of back: Secondary | ICD-10-CM | POA: Diagnosis not present

## 2018-09-28 DIAGNOSIS — M9902 Segmental and somatic dysfunction of thoracic region: Secondary | ICD-10-CM | POA: Diagnosis not present

## 2018-09-29 LAB — NOVEL CORONAVIRUS, NAA: SARS-CoV-2, NAA: NOT DETECTED

## 2018-11-03 ENCOUNTER — Ambulatory Visit: Payer: Medicare Other

## 2018-11-03 DIAGNOSIS — M5134 Other intervertebral disc degeneration, thoracic region: Secondary | ICD-10-CM | POA: Diagnosis not present

## 2018-11-03 DIAGNOSIS — M9901 Segmental and somatic dysfunction of cervical region: Secondary | ICD-10-CM | POA: Diagnosis not present

## 2018-11-03 DIAGNOSIS — M62830 Muscle spasm of back: Secondary | ICD-10-CM | POA: Diagnosis not present

## 2018-11-03 DIAGNOSIS — M9902 Segmental and somatic dysfunction of thoracic region: Secondary | ICD-10-CM | POA: Diagnosis not present

## 2018-11-10 ENCOUNTER — Other Ambulatory Visit: Payer: Self-pay

## 2018-11-10 ENCOUNTER — Ambulatory Visit (INDEPENDENT_AMBULATORY_CARE_PROVIDER_SITE_OTHER): Payer: Medicare Other

## 2018-11-10 DIAGNOSIS — Z23 Encounter for immunization: Secondary | ICD-10-CM | POA: Diagnosis not present

## 2018-11-24 DIAGNOSIS — M62830 Muscle spasm of back: Secondary | ICD-10-CM | POA: Diagnosis not present

## 2018-11-24 DIAGNOSIS — M9902 Segmental and somatic dysfunction of thoracic region: Secondary | ICD-10-CM | POA: Diagnosis not present

## 2018-11-24 DIAGNOSIS — M9901 Segmental and somatic dysfunction of cervical region: Secondary | ICD-10-CM | POA: Diagnosis not present

## 2018-11-24 DIAGNOSIS — M5134 Other intervertebral disc degeneration, thoracic region: Secondary | ICD-10-CM | POA: Diagnosis not present

## 2018-12-07 DIAGNOSIS — M9902 Segmental and somatic dysfunction of thoracic region: Secondary | ICD-10-CM | POA: Diagnosis not present

## 2018-12-07 DIAGNOSIS — M5134 Other intervertebral disc degeneration, thoracic region: Secondary | ICD-10-CM | POA: Diagnosis not present

## 2018-12-07 DIAGNOSIS — M9901 Segmental and somatic dysfunction of cervical region: Secondary | ICD-10-CM | POA: Diagnosis not present

## 2018-12-07 DIAGNOSIS — M62830 Muscle spasm of back: Secondary | ICD-10-CM | POA: Diagnosis not present

## 2018-12-29 DIAGNOSIS — M9901 Segmental and somatic dysfunction of cervical region: Secondary | ICD-10-CM | POA: Diagnosis not present

## 2018-12-29 DIAGNOSIS — M9902 Segmental and somatic dysfunction of thoracic region: Secondary | ICD-10-CM | POA: Diagnosis not present

## 2018-12-29 DIAGNOSIS — M62830 Muscle spasm of back: Secondary | ICD-10-CM | POA: Diagnosis not present

## 2018-12-29 DIAGNOSIS — M5134 Other intervertebral disc degeneration, thoracic region: Secondary | ICD-10-CM | POA: Diagnosis not present

## 2019-01-07 DIAGNOSIS — H6123 Impacted cerumen, bilateral: Secondary | ICD-10-CM | POA: Diagnosis not present

## 2019-01-07 DIAGNOSIS — H902 Conductive hearing loss, unspecified: Secondary | ICD-10-CM | POA: Diagnosis not present

## 2019-01-25 DIAGNOSIS — M9901 Segmental and somatic dysfunction of cervical region: Secondary | ICD-10-CM | POA: Diagnosis not present

## 2019-01-25 DIAGNOSIS — M62830 Muscle spasm of back: Secondary | ICD-10-CM | POA: Diagnosis not present

## 2019-01-25 DIAGNOSIS — M5134 Other intervertebral disc degeneration, thoracic region: Secondary | ICD-10-CM | POA: Diagnosis not present

## 2019-01-25 DIAGNOSIS — M9902 Segmental and somatic dysfunction of thoracic region: Secondary | ICD-10-CM | POA: Diagnosis not present

## 2019-03-31 DIAGNOSIS — M5134 Other intervertebral disc degeneration, thoracic region: Secondary | ICD-10-CM | POA: Diagnosis not present

## 2019-03-31 DIAGNOSIS — M9901 Segmental and somatic dysfunction of cervical region: Secondary | ICD-10-CM | POA: Diagnosis not present

## 2019-03-31 DIAGNOSIS — M9902 Segmental and somatic dysfunction of thoracic region: Secondary | ICD-10-CM | POA: Diagnosis not present

## 2019-03-31 DIAGNOSIS — M62830 Muscle spasm of back: Secondary | ICD-10-CM | POA: Diagnosis not present

## 2019-04-06 ENCOUNTER — Ambulatory Visit: Payer: Medicare Other | Attending: Internal Medicine

## 2019-04-06 DIAGNOSIS — Z23 Encounter for immunization: Secondary | ICD-10-CM

## 2019-04-06 NOTE — Progress Notes (Signed)
   Covid-19 Vaccination Clinic  Name:  Christina Hammond    MRN: [id number] DOB: 12/26/1950  04/06/2019  [name] was observed post Covid-19 immunization for 15 minutes without incidence. She was provided with Vaccine Information Sheet and instruction to access the V-Safe system.   [name] was instructed to call 911 with any severe reactions post vaccine: Marland Kitchen Difficulty breathing  . Swelling of your face and throat  . A fast heartbeat  . A bad rash all over your body  . Dizziness and weakness    Immunizations Administered    Name Date Dose VIS Date Route   Pfizer COVID-19 Vaccine 04/06/2019  5:52 PM 0.3 mL 02/26/2019 Intramuscular   Manufacturer: [place]   Lot: [id number]   [place]: [id number]

## 2019-04-15 DIAGNOSIS — H2513 Age-related nuclear cataract, bilateral: Secondary | ICD-10-CM | POA: Diagnosis not present

## 2019-04-26 ENCOUNTER — Ambulatory Visit: Payer: Medicare Other

## 2019-04-26 ENCOUNTER — Ambulatory Visit: Payer: Medicare Other | Attending: Internal Medicine

## 2019-04-26 DIAGNOSIS — Z23 Encounter for immunization: Secondary | ICD-10-CM

## 2019-04-26 NOTE — Progress Notes (Signed)
   Covid-19 Vaccination Clinic  Name:  Christina Hammond    MRN: [id number] DOB: 14-Nov-1950  04/26/2019  Ms. Soy was observed post Covid-19 immunization for 15 minutes without incidence. She was provided with Vaccine Information Sheet and instruction to access the V-Safe system.   [name] was instructed to call 911 with any severe reactions post vaccine: Marland Kitchen Difficulty breathing  . Swelling of your face and throat  . A fast heartbeat  . A bad rash all over your body  . Dizziness and weakness    Immunizations Administered    Name Date Dose VIS Date Route   Pfizer COVID-19 Vaccine 04/26/2019 11:50 AM 0.3 mL 02/26/2019 Intramuscular   Manufacturer: [place]   Lot: [id number]   [place]: [id number]

## 2019-04-27 DIAGNOSIS — M9901 Segmental and somatic dysfunction of cervical region: Secondary | ICD-10-CM | POA: Diagnosis not present

## 2019-04-27 DIAGNOSIS — M62830 Muscle spasm of back: Secondary | ICD-10-CM | POA: Diagnosis not present

## 2019-04-27 DIAGNOSIS — M9902 Segmental and somatic dysfunction of thoracic region: Secondary | ICD-10-CM | POA: Diagnosis not present

## 2019-04-27 DIAGNOSIS — M5134 Other intervertebral disc degeneration, thoracic region: Secondary | ICD-10-CM | POA: Diagnosis not present

## 2019-05-25 DIAGNOSIS — M62830 Muscle spasm of back: Secondary | ICD-10-CM | POA: Diagnosis not present

## 2019-05-25 DIAGNOSIS — Z1231 Encounter for screening mammogram for malignant neoplasm of breast: Secondary | ICD-10-CM

## 2019-05-25 DIAGNOSIS — M5134 Other intervertebral disc degeneration, thoracic region: Secondary | ICD-10-CM | POA: Diagnosis not present

## 2019-05-25 DIAGNOSIS — M9902 Segmental and somatic dysfunction of thoracic region: Secondary | ICD-10-CM | POA: Diagnosis not present

## 2019-05-25 DIAGNOSIS — M9901 Segmental and somatic dysfunction of cervical region: Secondary | ICD-10-CM | POA: Diagnosis not present

## 2019-06-17 ENCOUNTER — Ambulatory Visit
Admission: RE | Admit: 2019-06-17 | Discharge: 2019-06-17 | Disposition: A | Discharge status: Home / Self Care | Payer: Medicare Other | Source: Ambulatory Visit | Attending: Internal Medicine | Admitting: Internal Medicine

## 2019-06-17 DIAGNOSIS — Z1231 Encounter for screening mammogram for malignant neoplasm of breast: Secondary | ICD-10-CM | POA: Diagnosis not present

## 2019-06-22 DIAGNOSIS — D485 Neoplasm of uncertain behavior of skin: Secondary | ICD-10-CM | POA: Diagnosis not present

## 2019-06-22 DIAGNOSIS — L821 Other seborrheic keratosis: Secondary | ICD-10-CM | POA: Diagnosis not present

## 2019-06-22 DIAGNOSIS — L814 Other melanin hyperpigmentation: Secondary | ICD-10-CM | POA: Diagnosis not present

## 2019-06-22 DIAGNOSIS — L570 Actinic keratosis: Secondary | ICD-10-CM | POA: Diagnosis not present

## 2019-06-29 DIAGNOSIS — M5134 Other intervertebral disc degeneration, thoracic region: Secondary | ICD-10-CM | POA: Diagnosis not present

## 2019-06-29 DIAGNOSIS — M9902 Segmental and somatic dysfunction of thoracic region: Secondary | ICD-10-CM | POA: Diagnosis not present

## 2019-06-29 DIAGNOSIS — M9901 Segmental and somatic dysfunction of cervical region: Secondary | ICD-10-CM | POA: Diagnosis not present

## 2019-06-29 DIAGNOSIS — M62830 Muscle spasm of back: Secondary | ICD-10-CM | POA: Diagnosis not present

## 2019-07-08 DIAGNOSIS — H902 Conductive hearing loss, unspecified: Secondary | ICD-10-CM | POA: Diagnosis not present

## 2019-07-08 DIAGNOSIS — H6123 Impacted cerumen, bilateral: Secondary | ICD-10-CM | POA: Diagnosis not present

## 2019-07-27 DIAGNOSIS — M5134 Other intervertebral disc degeneration, thoracic region: Secondary | ICD-10-CM | POA: Diagnosis not present

## 2019-07-27 DIAGNOSIS — M9901 Segmental and somatic dysfunction of cervical region: Secondary | ICD-10-CM | POA: Diagnosis not present

## 2019-07-27 DIAGNOSIS — M9902 Segmental and somatic dysfunction of thoracic region: Secondary | ICD-10-CM | POA: Diagnosis not present

## 2019-07-27 DIAGNOSIS — M62830 Muscle spasm of back: Secondary | ICD-10-CM | POA: Diagnosis not present

## 2019-08-24 DIAGNOSIS — M62830 Muscle spasm of back: Secondary | ICD-10-CM | POA: Diagnosis not present

## 2019-08-24 DIAGNOSIS — M5134 Other intervertebral disc degeneration, thoracic region: Secondary | ICD-10-CM | POA: Diagnosis not present

## 2019-08-24 DIAGNOSIS — M9902 Segmental and somatic dysfunction of thoracic region: Secondary | ICD-10-CM | POA: Diagnosis not present

## 2019-08-24 DIAGNOSIS — M9901 Segmental and somatic dysfunction of cervical region: Secondary | ICD-10-CM | POA: Diagnosis not present

## 2019-09-22 ENCOUNTER — Ambulatory Visit (INDEPENDENT_AMBULATORY_CARE_PROVIDER_SITE_OTHER): Payer: Medicare Other

## 2019-09-22 VITALS — Ht 67.0 in | Wt 166.0 lb

## 2019-09-22 DIAGNOSIS — Z0000 Encounter for general adult medical examination without abnormal findings: Secondary | ICD-10-CM | POA: Diagnosis not present

## 2019-09-22 NOTE — Progress Notes (Addendum)
Subjective:   Christina Hammond He is a 69 y.o. female who presents for Medicare Annual (Subsequent) preventive examination.  Review of Systems    No ROS.  Medicare Wellness Virtual Visit.   Cardiac Risk Factors include: advanced age (>62men, >31 women)     Objective:    Today's Vitals   09/22/19 0931  Weight: 166 lb (75.3 kg)  Height: 5\' 7"  (1.702 m)   Body mass index is 26 kg/m.  Advanced Directives 09/22/2019 09/21/2018 09/16/2017  Does Patient Have a Medical Advance Directive? Yes Yes Yes  Type of Advance Directive Living will;Healthcare Power of [place];Living will [place]  Does patient want to make changes to medical advance directive? No - Patient declined No - Patient declined No - Patient declined  Copy of [place] in Chart? No - copy requested No - copy requested -    Current Medications (verified) Outpatient Encounter Medications as of 09/22/2019  Medication Sig   conjugated estrogens (PREMARIN) vaginal cream 1 GRAM INTRAVAGINALLY  TWICE WEEKLY   Melatonin 10 MG TABS Take 2 tablets by mouth daily.   mometasone (NASONEX) 50 MCG/ACT nasal spray Place 2 sprays into the nose daily.   omeprazole (PRILOSEC) 20 MG capsule Take 20 mg by mouth daily.   traZODone (DESYREL) 50 MG tablet Take 0.5-1 tablets (25-50 mg total) by mouth at bedtime as needed for sleep.   zolpidem (AMBIEN) 10 MG tablet TAKE ONE TABLET BY MOUTH AT BEDTIME AS NEEDED FOR SLEEP   No facility-administered encounter medications on file as of 09/22/2019.    Allergies (verified) Diclofenac and Ibuprofen   History: Past Medical History:  Diagnosis Date   Cancer ([place])    basal cell,  [facility]    Restless legs syndrome (RLS)    managed with minimal meds   Past Surgical History:  Procedure Laterality Date   ABDOMINAL HYSTERECTOMY  1979   APPENDECTOMY  1956   CESAREAN SECTION     CHOLECYSTECTOMY     Family History  Problem Relation  Age of Onset   Diabetes Mother    Stroke Mother    Cancer Father    Heart disease Father    Cancer Maternal Aunt    Mental retardation Maternal Aunt    Alcohol abuse Maternal Uncle    Breast cancer Cousin    Social History   Socioeconomic History   Marital status: Married    Spouse name: Not on file   Number of children: Not on file   Years of education: Not on file   Highest education level: Not on file  Occupational History   Occupation: real estate agent  Tobacco Use   Smoking status: Never Smoker   Smokeless tobacco: Never Used  Substance and Sexual Activity   Alcohol use: Yes    Alcohol/week: 1.0 - 2.0 standard drink    Types: 1 - 2 Glasses of wine per week    Comment: weekends, social    Drug use: No   Sexual activity: Not on file  Other Topics Concern   Not on file  Social History Narrative   Not on file   Social Determinants of Health   Financial Resource Strain:    Difficulty of Paying Living Expenses:   Food Insecurity:    Worried About Charity fundraiser in the Last Year:    Arboriculturist in the Last Year:   Transportation Needs:    Film/video editor (Medical):  Lack of Transportation (Non-Medical):   Physical Activity:    Days of Exercise per Week:    Minutes of Exercise per Session:   Stress:    Feeling of Stress :   Social Connections:    Frequency of Communication with Friends and Family:    Frequency of Social Gatherings with Friends and Family:    Attends Religious Services:    Active Member of Clubs or Organizations:    Attends Music therapist:    Marital Status:     Tobacco Counseling Counseling given: Not Answered   Clinical Intake:  Pre-visit preparation completed: Yes        Diabetes: No  How often do you need to have someone help you when you read instructions, pamphlets, or other written materials from your doctor or pharmacy?: 1 - Never        Activities of Daily Living In your present  state of health, do you have any difficulty performing the following activities: 09/22/2019  Hearing? N  Vision? N  Difficulty concentrating or making decisions? N  Walking or climbing stairs? N  Dressing or bathing? N  Doing errands, shopping? N  Preparing Food and eating ? N  Using the Toilet? N  In the past six months, have you accidently leaked urine? N  Do you have problems with loss of bowel control? N  Managing your Medications? N  Managing your Finances? N  Housekeeping or managing your Housekeeping? N  Some recent data might be hidden    Patient Care Team: Crecencio Mc, MD as PCP - General (Internal Medicine)  Indicate any recent Medical Services you may have received from other than [facility] providers in the past year (date may be approximate).     Assessment:   This is a routine wellness examination for Christina Hammond.  I connected with Christina Hammond today by telephone and verified that I am speaking with the correct person using two identifiers. Location patient: home Location provider: work Persons participating in the virtual visit: patient, Marine scientist.    Interactive audio and video telecommunications were attempted between this provider and patient, however failed, due to patient having technical difficulties OR patient did not have access to video capability.  We continued and completed visit with audio only.  Some vital signs may be absent or patient reported.   Hearing/Vision screen  Hearing Screening   125Hz  250Hz  500Hz  1000Hz  2000Hz  3000Hz  4000Hz  6000Hz  8000Hz   Right ear:           Left ear:           Comments: Patient is able to hear conversational tones without difficulty.  No issues reported.   Vision Screening Comments: Followed by [facility] Wears corrective lenses Visual acuity not assessed, virtual visit.  They have seen their ophthalmologist in the last 12 months.    Dietary issues and exercise activities discussed: Current Exercise Habits: Home  exercise routine, Type of exercise: walking;treadmill;strength training/weights;calisthenics, Time (Minutes): 30, Frequency (Times/Week): 4, Weekly Exercise (Minutes/Week): 120, Intensity: Mild  Healthy diet  Fluids- good intake Caffeine- 1 cup of coffee  Goals      Maintain Healthy Lifestyle     Stay active Stay hydrated Healthy diet       Depression Screen PHQ 2/9 Scores 09/22/2019 09/21/2018 09/16/2017 04/02/2017 01/30/2016 08/11/2014 07/07/2013  PHQ - 2 Score 0 0 0 0 0 0 0  PHQ- 9 Score - - - 0 - - -    Fall Risk Fall Risk  09/22/2019 09/21/2018 09/16/2017 04/02/2017 01/30/2016  Falls in the past year? 0 0 No No No  Number falls in past yr: 0 - - - -  Follow up Falls evaluation completed - - - -   Handrails in use when climbing stairs? Yes  Home free of loose throw rugs in walkways, pet beds, electrical cords, etc? Yes  Adequate lighting in your home to reduce risk of falls? Yes   ASSISTIVE DEVICES UTILIZED TO PREVENT FALLS:  Life alert? No  Use of a cane, walker or w/c? No  Grab bars in the bathroom? No  Shower chair or bench in shower? No  Elevated toilet seat? Yes   TIMED UP AND GO:  Was the test performed? No . Virtual visit.   Medications- rotating cbd sleepy time oil and ambien for better rest. Taking melatonin 10-15mg  30-45 minutes before bedtime. Records last night rest 11:30-7:30 (8 hrs).  Cognitive Function: Patient is alert and oriented x3. Currently works with [employer]. Denies difficulty focusing, concentrating, making decisions, memory loss.   MMSE - Mini Mental State Exam 09/22/2019 09/16/2017  Not completed: Unable to complete -  Orientation to time - 5  Orientation to Place - 5  Registration - 3  Attention/ Calculation - 5  Recall - 3  Language- name 2 objects - 2  Language- repeat - 1  Language- follow 3 step command - 3  Language- read & follow direction - 1  Write a sentence - 1  Copy design - 1  Total score - 30     6CIT Screen 09/21/2018    What Year? 0 points  What month? 0 points  What time? 0 points  Count back from 20 0 points  Months in reverse 0 points  Repeat phrase 0 points  Total Score 0   Immunizations Immunization History  Administered Date(s) Administered   Fluad Quad(high Dose 65+) 11/10/2018   Influenza Split 01/10/2012, 12/16/2013   Influenza, High Dose Seasonal PF 01/10/2017   Influenza, Seasonal, Injecte, Preservative Fre 11/25/2014   Influenza,inj,Quad PF,6+ Mos 12/10/2012   Influenza-Unspecified 01/18/2016, 01/12/2018   PFIZER SARS-COV-2 Vaccination 04/06/2019, 04/26/2019   Pneumococcal Conjugate-13 07/07/2013   Pneumococcal Polysaccharide-23 01/10/2017   Tdap 10/11/2011   Zoster 01/26/2014   Health Maintenance There are no preventive care reminders to display for this patient. Health Maintenance  Topic Date Due   INFLUENZA VACCINE  10/17/2019   MAMMOGRAM  06/16/2020   Fecal DNA (Cologuard)  08/06/2020   TETANUS/TDAP  10/10/2021   DEXA SCAN  Completed   COVID-19 Vaccine  Completed   Hepatitis C Screening  Completed   PNA vac Low Risk Adult  Completed   Lung Cancer Screening: (Low Dose CT Chest recommended if Age 28-80 years, 30 pack-year currently smoking OR have quit w/in 15years.) does not qualify.   Dental Screening: Recommended annual dental exams for proper oral hygiene  Community Resource Referral / Chronic Care Management: CRR required this visit?  No   CCM required this visit?  No      Plan:   Keep all routine maintenance appointments.   I have personally reviewed and noted the following in the patient's chart:   Medical and social history Use of alcohol, tobacco or illicit drugs  Current medications and supplements Functional ability and status Nutritional status Physical activity Advanced directives List of other physicians Hospitalizations, surgeries, and ER visits in previous 12 months Vitals Screenings to include cognitive, depression, and falls Referrals  and appointments  In addition,  I have reviewed and discussed with patient certain preventive protocols, quality metrics, and best practice recommendations. A written personalized care plan for preventive services as well as general preventive health recommendations were provided to patient via mychart.     OBrien-Blaney, Eliz Nigg L, LPN   09/23/4325    I have reviewed the above information and agree with above.   Deborra Medina, MD

## 2019-09-22 NOTE — Patient Instructions (Addendum)
Christina Hammond , Thank you for taking time to come for your Medicare Wellness Visit. I appreciate your ongoing commitment to your health goals. Please review the following plan we discussed and let me know if I can assist you in the future.   These are the goals we discussed: Goals    . Maintain Healthy Lifestyle     Stay active Stay hydrated Healthy diet       This is a list of the screening recommended for you and due dates:  Health Maintenance  Topic Date Due  . Flu Shot  10/17/2019  . Mammogram  06/16/2020  . Cologuard (Stool DNA test)  08/06/2020  . Tetanus Vaccine  10/10/2021  . DEXA scan (bone density measurement)  Completed  . COVID-19 Vaccine  Completed  .  Hepatitis C: One time screening is recommended by Center for Disease Control  (CDC) for  adults born from [birth year] through 1967.   Completed  . Pneumonia vaccines  Completed    Immunizations Immunization History  Administered Date(s) Administered  . Fluad Quad(high Dose 71+) 11/10/2018  . Influenza Split 01/10/2012, 12/16/2013  . Influenza, High Dose Seasonal PF 01/10/2017  . Influenza, Seasonal, Injecte, Preservative Fre 11/25/2014  . Influenza,inj,Quad PF,6+ Mos 12/10/2012  . Influenza-Unspecified 01/18/2016, 01/12/2018  . PFIZER SARS-COV-2 Vaccination 04/06/2019, 04/26/2019  . Pneumococcal Conjugate-13 07/07/2013  . Pneumococcal Polysaccharide-23 01/10/2017  . Tdap 10/11/2011  . Zoster 01/26/2014    Advanced directives: End of life planning; Advance aging; Advanced directives discussed.  Copy of current HCPOA/Living Will requested.    Conditions/risks identified: none new.  Follow up in one year for your annual wellness visit.   Preventive Care 71 Years and Older, Female Preventive care refers to lifestyle choices and visits with your health care provider that can promote health and wellness. What does preventive care include?  A yearly physical exam. This is also called an annual well check.  Dental  exams once or twice a year.  Routine eye exams. Ask your health care provider how often you should have your eyes checked.  Personal lifestyle choices, including:  Daily care of your teeth and gums.  Regular physical activity.  Eating a healthy diet.  Avoiding tobacco and drug use.  Limiting alcohol use.  Practicing safe sex.  Taking low-dose aspirin every day.  Taking vitamin and mineral supplements as recommended by your health care provider. What happens during an annual well check? The services and screenings done by your health care provider during your annual well check will depend on your age, overall health, lifestyle risk factors, and family history of disease. Counseling  Your health care provider may ask you questions about your:  Alcohol use.  Tobacco use.  Drug use.  Emotional well-being.  Home and relationship well-being.  Sexual activity.  Eating habits.  History of falls.  Memory and ability to understand (cognition).  Work and work Statistician.  Reproductive health. Screening  You may have the following tests or measurements:  Height, weight, and BMI.  Blood pressure.  Lipid and cholesterol levels. These may be checked every 5 years, or more frequently if you are over 71 years old.  Skin check.  Lung cancer screening. You may have this screening every year starting at age 71 if you have a 30-pack-year history of smoking and currently smoke or have quit within the past 15 years.  Fecal occult blood test (FOBT) of the stool. You may have this test every year starting at age 71.  Flexible  sigmoidoscopy or colonoscopy. You may have a sigmoidoscopy every 5 years or a colonoscopy every 10 years starting at age 71.  Hepatitis C blood test.  Hepatitis B blood test.  Sexually transmitted disease (STD) testing.  Diabetes screening. This is done by checking your blood sugar (glucose) after you have not eaten for a while (fasting). You may  have this done every 1-3 years.  Bone density scan. This is done to screen for osteoporosis. You may have this done starting at age 71.  Mammogram. This may be done every 1-2 years. Talk to your health care provider about how often you should have regular mammograms. Talk with your health care provider about your test results, treatment options, and if necessary, the need for more tests. Vaccines  Your health care provider may recommend certain vaccines, such as:  Influenza vaccine. This is recommended every year.  Tetanus, diphtheria, and acellular pertussis (Tdap, Td) vaccine. You may need a Td booster every 10 years.  Zoster vaccine. You may need this after age 3.  Pneumococcal 13-valent conjugate (PCV13) vaccine. One dose is recommended after age 71.  Pneumococcal polysaccharide (PPSV23) vaccine. One dose is recommended after age 71. Talk to your health care provider about which screenings and vaccines you need and how often you need them. This information is not intended to replace advice given to you by your health care provider. Make sure you discuss any questions you have with your health care provider. Document Released: 03/31/2015 Document Revised: 11/22/2015 Document Reviewed: 01/03/2015 Elsevier Interactive Patient Education  2017 [place] Prevention in the Home Falls can cause injuries. They can happen to people of all ages. There are many things you can do to make your home safe and to help prevent falls. What can I do on the outside of my home?  Regularly fix the edges of walkways and driveways and fix any cracks.  Remove anything that might make you trip as you walk through a door, such as a raised step or threshold.  Trim any bushes or trees on the path to your home.  Use bright outdoor lighting.  Clear any walking paths of anything that might make someone trip, such as rocks or tools.  Regularly check to see if handrails are loose or broken. Make  sure that both sides of any steps have handrails.  Any raised decks and porches should have guardrails on the edges.  Have any leaves, snow, or ice cleared regularly.  Use sand or salt on walking paths during winter.  Clean up any spills in your garage right away. This includes oil or grease spills. What can I do in the bathroom?  Use night lights.  Install grab bars by the toilet and in the tub and shower. Do not use towel bars as grab bars.  Use non-skid mats or decals in the tub or shower.  If you need to sit down in the shower, use a plastic, non-slip stool.  Keep the floor dry. Clean up any water that spills on the floor as soon as it happens.  Remove soap buildup in the tub or shower regularly.  Attach bath mats securely with double-sided non-slip rug tape.  Do not have throw rugs and other things on the floor that can make you trip. What can I do in the bedroom?  Use night lights.  Make sure that you have a light by your bed that is easy to reach.  Do not use any sheets or blankets that  are too big for your bed. They should not hang down onto the floor.  Have a firm chair that has side arms. You can use this for support while you get dressed.  Do not have throw rugs and other things on the floor that can make you trip. What can I do in the kitchen?  Clean up any spills right away.  Avoid walking on wet floors.  Keep items that you use a lot in easy-to-reach places.  If you need to reach something above you, use a strong step stool that has a grab bar.  Keep electrical cords out of the way.  Do not use floor polish or wax that makes floors slippery. If you must use wax, use non-skid floor wax.  Do not have throw rugs and other things on the floor that can make you trip. What can I do with my stairs?  Do not leave any items on the stairs.  Make sure that there are handrails on both sides of the stairs and use them. Fix handrails that are broken or loose.  Make sure that handrails are as long as the stairways.  Check any carpeting to make sure that it is firmly attached to the stairs. Fix any carpet that is loose or worn.  Avoid having throw rugs at the top or bottom of the stairs. If you do have throw rugs, attach them to the floor with carpet tape.  Make sure that you have a light switch at the top of the stairs and the bottom of the stairs. If you do not have them, ask someone to add them for you. What else can I do to help prevent falls?  Wear shoes that:  Do not have high heels.  Have rubber bottoms.  Are comfortable and fit you well.  Are closed at the toe. Do not wear sandals.  If you use a stepladder:  Make sure that it is fully opened. Do not climb a closed stepladder.  Make sure that both sides of the stepladder are locked into place.  Ask someone to hold it for you, if possible.  Clearly mark and make sure that you can see:  Any grab bars or handrails.  First and last steps.  Where the edge of each step is.  Use tools that help you move around (mobility aids) if they are needed. These include:  Canes.  Walkers.  Scooters.  Crutches.  Turn on the lights when you go into a dark area. Replace any light bulbs as soon as they burn out.  Set up your furniture so you have a clear path. Avoid moving your furniture around.  If any of your floors are uneven, fix them.  If there are any pets around you, be aware of where they are.  Review your medicines with your doctor. Some medicines can make you feel dizzy. This can increase your chance of falling. Ask your doctor what other things that you can do to help prevent falls. This information is not intended to replace advice given to you by your health care provider. Make sure you discuss any questions you have with your health care provider. Document Released: 12/29/2008 Document Revised: 08/10/2015 Document Reviewed: 04/08/2014 Elsevier Interactive Patient  Education  2017 [employer].

## 2019-09-28 DIAGNOSIS — M9902 Segmental and somatic dysfunction of thoracic region: Secondary | ICD-10-CM | POA: Diagnosis not present

## 2019-09-28 DIAGNOSIS — M5134 Other intervertebral disc degeneration, thoracic region: Secondary | ICD-10-CM | POA: Diagnosis not present

## 2019-09-28 DIAGNOSIS — M9901 Segmental and somatic dysfunction of cervical region: Secondary | ICD-10-CM | POA: Diagnosis not present

## 2019-09-28 DIAGNOSIS — M62830 Muscle spasm of back: Secondary | ICD-10-CM | POA: Diagnosis not present

## 2019-10-22 ENCOUNTER — Other Ambulatory Visit: Payer: Self-pay | Admitting: Internal Medicine

## 2019-10-22 NOTE — Telephone Encounter (Signed)
Refill request for Christina Hammond, last seen 09-23-2018, last filled 06-08-2018.  Please advise. mychart has been sent to have patient schedule appointment

## 2019-10-23 IMAGING — MG 2D DIGITAL SCREENING BILATERAL MAMMOGRAM WITH CAD AND ADJUNCT TO
9 of 12 series · 9 of 28 positions shown · non-contrast
Comparison: Previous exam(s).

CLINICAL DATA: Screening.

EXAM:
2D DIGITAL SCREENING BILATERAL MAMMOGRAM WITH CAD AND ADJUNCT TOMO

[L CC synth-2D]
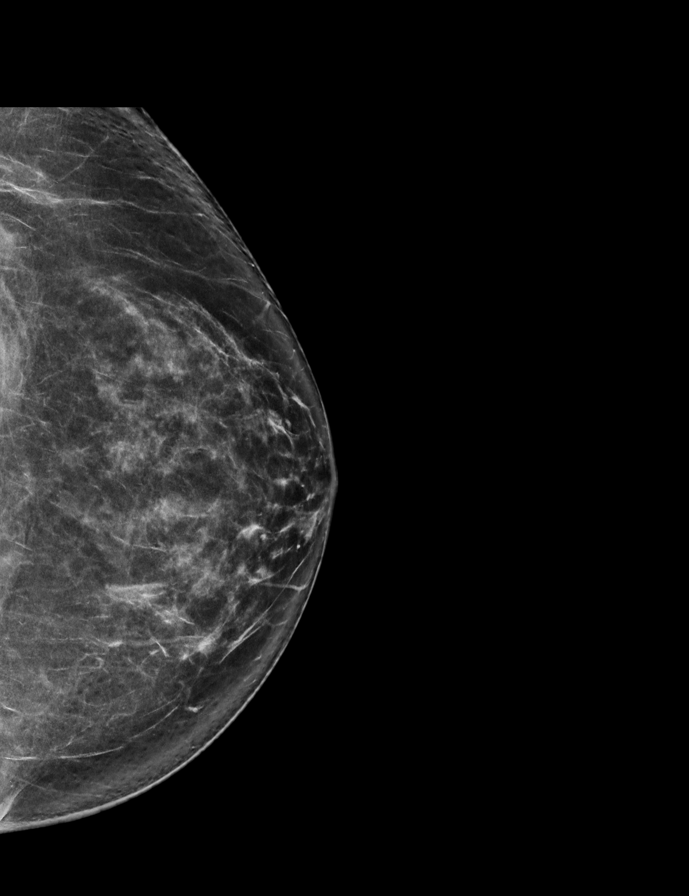

[L CC]
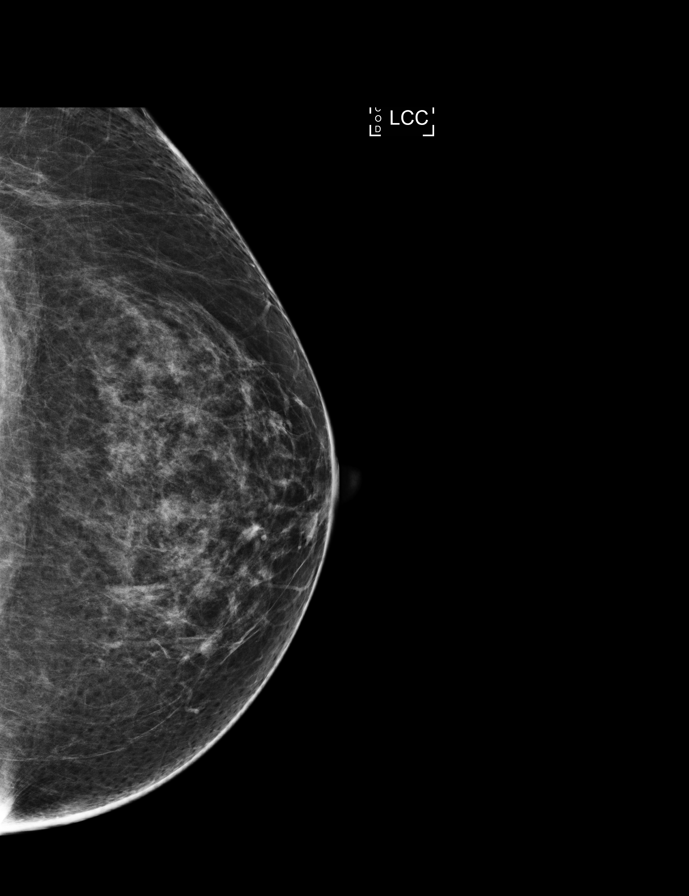

[L MLO]
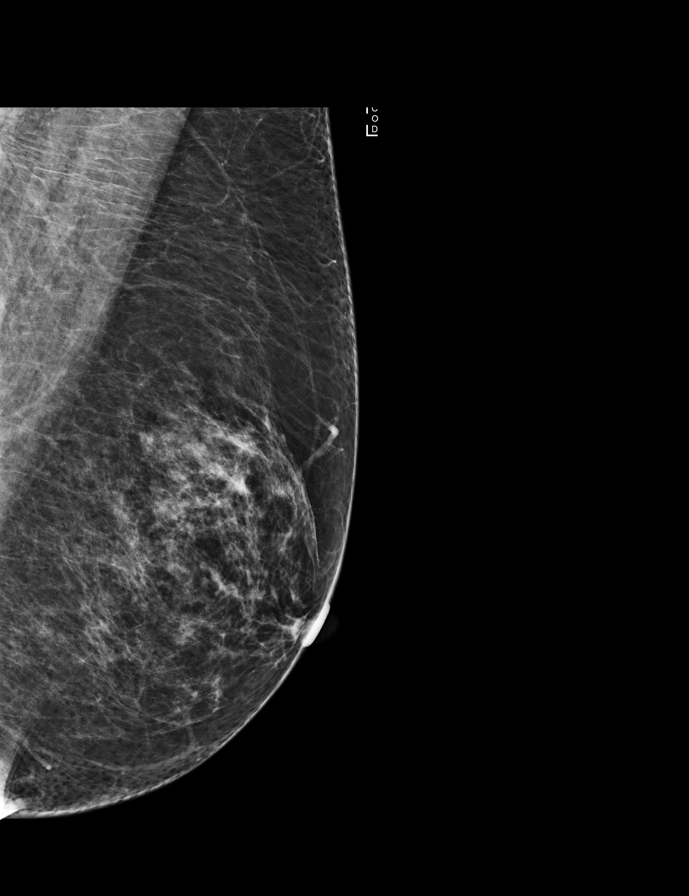

[R MLO]
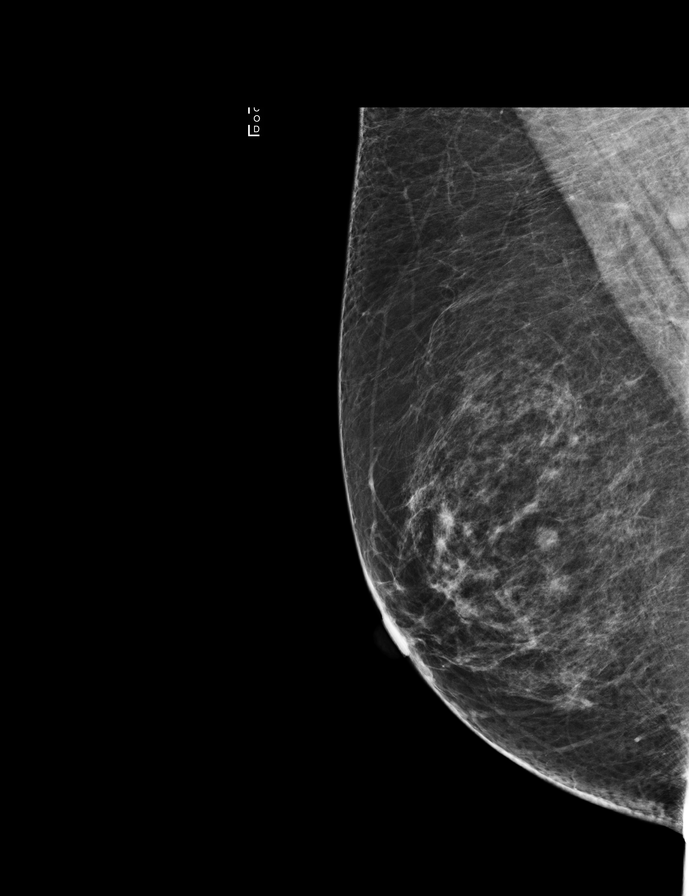

[R CC synth-2D]
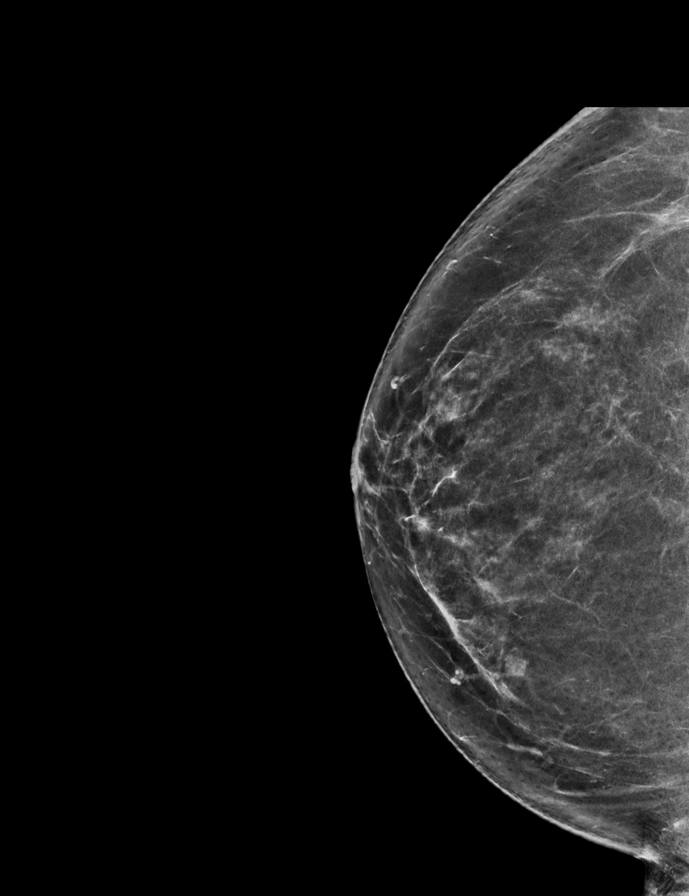

[L MLO synth-2D]
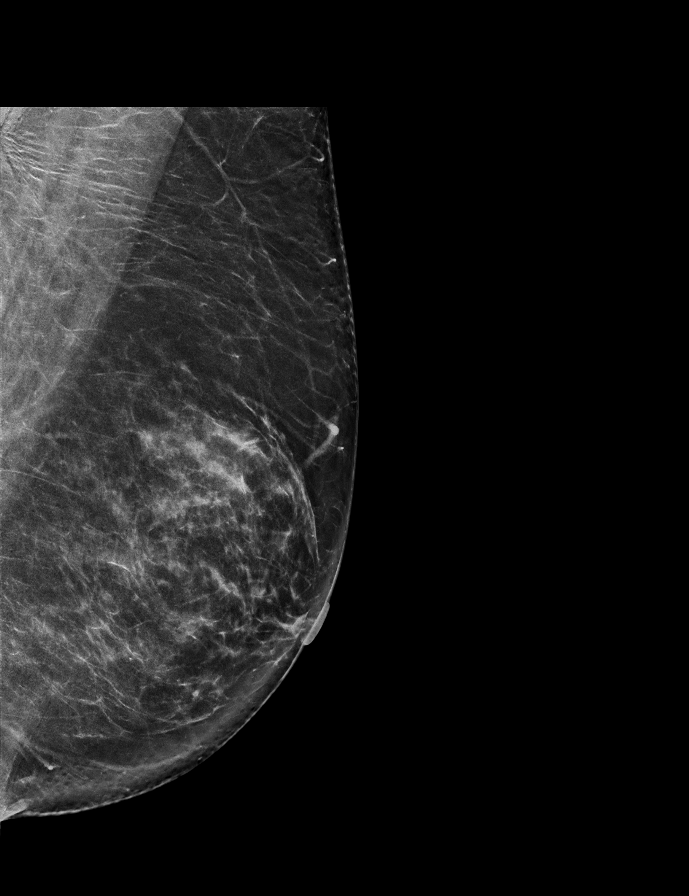

[R MLO synth-2D]
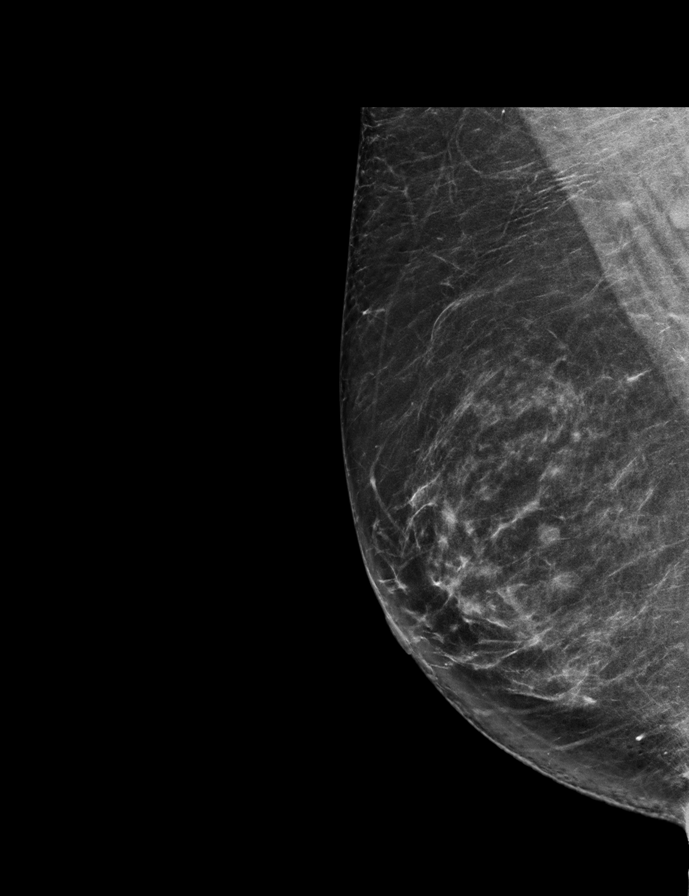

[R CC]
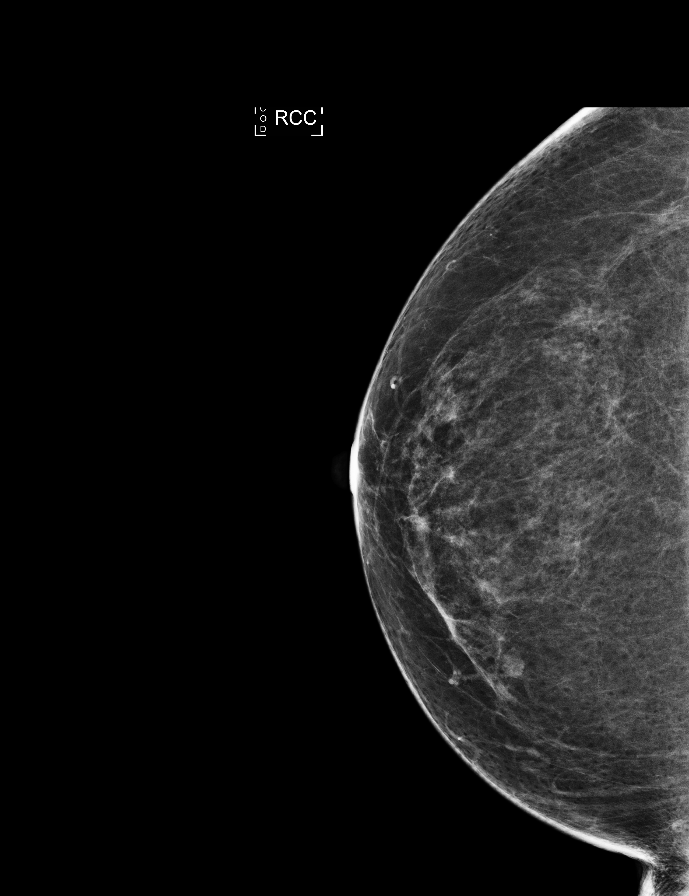

[R CC tomo · tomo slice 39/76.0]
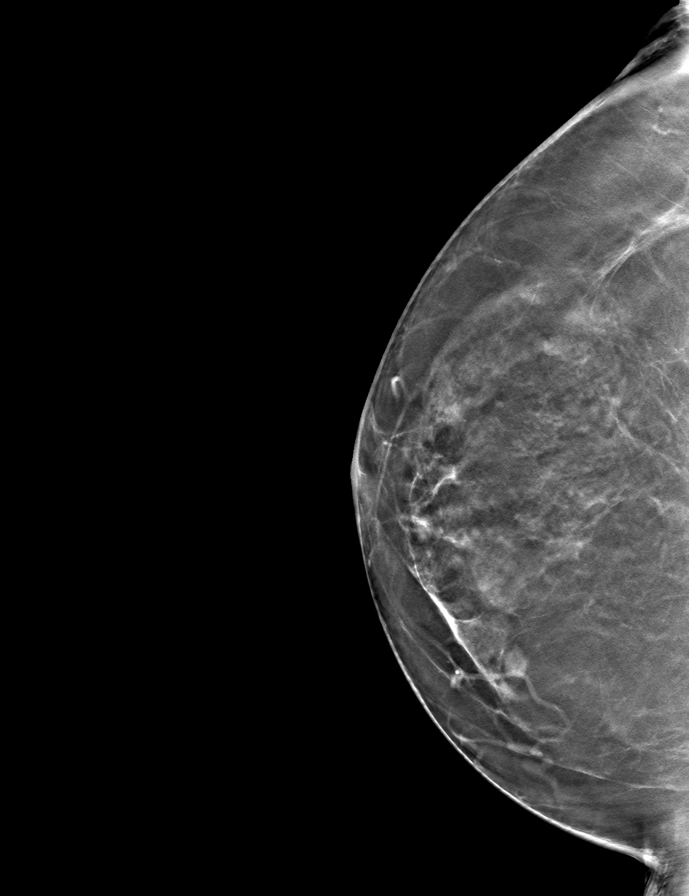

[9 of 28 positions shown; findings below may reference images not displayed]

ACR Breast Density Category b: There are scattered areas of
fibroglandular density.
FINDINGS: There are no findings suspicious for malignancy. Images were
processed with CAD.
IMPRESSION: No mammographic evidence of malignancy. A result letter of this
screening mammogram will be mailed directly to the patient.

RECOMMENDATION:
Screening mammogram in one year. (Code:97-6-RS4)

BI-RADS CATEGORY  1: Negative.

## 2019-10-26 DIAGNOSIS — M62830 Muscle spasm of back: Secondary | ICD-10-CM | POA: Diagnosis not present

## 2019-10-26 DIAGNOSIS — M5134 Other intervertebral disc degeneration, thoracic region: Secondary | ICD-10-CM | POA: Diagnosis not present

## 2019-10-26 DIAGNOSIS — M9901 Segmental and somatic dysfunction of cervical region: Secondary | ICD-10-CM | POA: Diagnosis not present

## 2019-10-26 DIAGNOSIS — M9902 Segmental and somatic dysfunction of thoracic region: Secondary | ICD-10-CM | POA: Diagnosis not present

## 2019-10-27 ENCOUNTER — Encounter: Payer: Self-pay | Admitting: Internal Medicine

## 2019-10-27 ENCOUNTER — Telehealth (INDEPENDENT_AMBULATORY_CARE_PROVIDER_SITE_OTHER): Payer: Medicare Other | Admitting: Internal Medicine

## 2019-10-27 VITALS — BP 125/80 | Ht 67.0 in | Wt 165.0 lb

## 2019-10-27 DIAGNOSIS — M8580 Other specified disorders of bone density and structure, unspecified site: Secondary | ICD-10-CM

## 2019-10-27 DIAGNOSIS — Z0000 Encounter for general adult medical examination without abnormal findings: Secondary | ICD-10-CM

## 2019-10-27 DIAGNOSIS — G4733 Obstructive sleep apnea (adult) (pediatric): Secondary | ICD-10-CM | POA: Diagnosis not present

## 2019-10-27 DIAGNOSIS — R5383 Other fatigue: Secondary | ICD-10-CM | POA: Diagnosis not present

## 2019-10-27 DIAGNOSIS — E785 Hyperlipidemia, unspecified: Secondary | ICD-10-CM

## 2019-10-27 DIAGNOSIS — E559 Vitamin D deficiency, unspecified: Secondary | ICD-10-CM | POA: Diagnosis not present

## 2019-10-27 DIAGNOSIS — F5101 Primary insomnia: Secondary | ICD-10-CM | POA: Diagnosis not present

## 2019-10-27 DIAGNOSIS — Z780 Asymptomatic menopausal state: Secondary | ICD-10-CM

## 2019-10-27 MED ORDER — ZOLPIDEM TARTRATE 10 MG PO TABS: 10.0000 mg | ORAL_TABLET | Freq: Every evening | ORAL | 4 refills | Status: DC | PRN

## 2019-10-27 NOTE — Progress Notes (Signed)
Virtual Visit via [facility]  This visit type was conducted due to national recommendations for restrictions regarding the COVID-19 pandemic (e.g. social distancing).  This format is felt to be most appropriate for this patient at this time.  All issues noted in this document were discussed and addressed.  No physical exam was performed (except for noted visual exam findings with Video Visits).   I connected with@ on 10/27/19 at  2:30 PM EDT by a video enabled telemedicine application  and verified that I am speaking with the correct person using two identifiers. Location patient: home Location provider: work or home office Persons participating in the virtual visit: patient, provider  I discussed the limitations, risks, security and privacy concerns of performing an evaluation and management service by telephone and the availability of in person appointments. I also discussed with the patient that there may be a patient responsible charge related to this service. The patient expressed understanding and agreed to proceed.  Reason for visit: medication refill   HPI:  69 yr female  With no signficant PMH other than chronic insomnia , presents for medication refill .    She feels generally well,  Still working full time as a Forensic psychologist.  Some chronic neck pain,   Getting adjusted monthly by chiropractor [place]. Uses tylenol/motrin prm  Insomnia:  Chronic, avoids ambien dependency by alternating between ambein,  CBD oil,  And melatonin . Also   Meditating.    OSA:  Using her CPAP nightly.    Patient has received both doses of the available COVID 19 vaccine without complications.  Patient continues to mask when outside of the home except when walking in yard or at safe distances from others .  Patient denies any change in mood or development of unhealthy behaviors resuting from the pandemic's restriction of activities and socialization.    ROS: Patient denies headache, fevers, malaise,  unintentional weight loss, skin rash, eye pain, sinus congestion and sinus pain, sore throat, dysphagia,  hemoptysis , cough, dyspnea, wheezing, chest pain, palpitations, orthopnea, edema, abdominal pain, nausea, melena, diarrhea, constipation, flank pain, dysuria, hematuria, urinary  Frequency, nocturia, numbness, tingling, seizures,  Focal weakness, Loss of consciousness,  Tremor, , depression, anxiety, and suicidal ideation.      Past Medical History:  Diagnosis Date  . Cancer ([place])    basal cell,  [facility]   . Restless legs syndrome (RLS)    managed with minimal meds    Past Surgical History:  Procedure Laterality Date  . ABDOMINAL HYSTERECTOMY  1979  . APPENDECTOMY  1956  . CESAREAN SECTION    . CHOLECYSTECTOMY      Family History  Problem Relation Age of Onset  . Diabetes Mother   . Stroke Mother   . Cancer Father   . Heart disease Father   . Cancer Maternal Aunt   . Mental retardation Maternal Aunt   . Alcohol abuse Maternal Uncle   . Breast cancer Cousin     SOCIAL HX:  reports that she has never smoked. She has never used smokeless tobacco. She reports current alcohol use of about 1.0 - 2.0 standard drink of alcohol per week. She reports that she does not use drugs.   Current Outpatient Medications:  Marland Kitchen  Melatonin 10 MG TABS, Take 2 tablets by mouth daily., Disp: , Rfl:  .  mometasone (NASONEX) 50 MCG/ACT nasal spray, Place 2 sprays into the nose daily., Disp: 17 g, Rfl: 12 .  omeprazole (PRILOSEC) 20 MG capsule,  Take 20 mg by mouth daily., Disp: , Rfl:  .  zolpidem (AMBIEN) 10 MG tablet, Take 1 tablet (10 mg total) by mouth at bedtime as needed. for sleep, Disp: 30 tablet, Rfl: 4  EXAM:  VITALS per patient if applicable:  GENERAL: alert, oriented, appears well and in no acute distress  HEENT: atraumatic, conjunttiva clear, no obvious abnormalities on inspection of external nose and ears  NECK: normal movements of the head and neck  LUNGS: on inspection no signs  of respiratory distress, breathing rate appears normal, no obvious gross SOB, gasping or wheezing  CV: no obvious cyanosis  MS: moves all visible extremities without noticeable abnormality  PSYCH/NEURO: pleasant and cooperative, no obvious depression or anxiety, speech and thought processing grossly intact  ASSESSMENT AND PLAN:  Discussed the following assessment and plan:  Dyslipidemia, goal LDL below 160 - Plan: Lipid panel  Vitamin D deficiency - Plan: VITAMIN D 25 Hydroxy (Vit-D Deficiency, Fractures)  Fatigue, unspecified type - Plan: Comprehensive metabolic panel, TSH, CBC with Differential/Platelet  Osteopenia after menopause - Plan: DG Bone Density  Encounter for preventive health examination  Primary insomnia  OSA (obstructive sleep apnea)  Encounter for preventive health examination She is up to date on all health maintenance screenings.  age appropriate education and counseling updated, referrals for preventative services and immunizations addressed, dietary and smoking counseling addressed, most recent labs reviewed.  I have personally reviewed and have noted:  1) the patient's medical and social history 2) The pt's use of alcohol, tobacco, and illicit drugs 3) The patient's current medications and supplements 4) Functional ability including ADL's, fall risk, home safety risk, hearing and visual impairment 5) Diet and physical activities 6) Evidence for depression or mood disorder 7) The patient's height, weight, and BMI have been recorded in the chart  I have made referrals, and provided counseling and education based on review of the above  Insomnia Chronic with recent worsening attributed to additional emotional triggers including the recent untimely death of her sister, and the COVID pandemic. She has no daytime symptoms of anxiety (other than the recent onset of loose stools).  Did not tolerate trazodone trial.  Using an alternating regimen of ambien CBD  oil and melatonin.  Refills for 6 months given   OSA (obstructive sleep apnea) Diagnosed by sleep study. She is wearing her CPAP every night a minimum of 6 hours per night and notes improved daytime wakefulness and decreased fatigue     I discussed the assessment and treatment plan with the patient. The patient was provided an opportunity to ask questions and all were answered. The patient agreed with the plan and demonstrated an understanding of the instructions.   The patient was advised to call back or seek an in-person evaluation if the symptoms worsen or if the condition fails to improve as anticipated.  I provided  30 minutes of non-face-to-face time during this encounter.   Crecencio Mc, MD

## 2019-10-29 ENCOUNTER — Other Ambulatory Visit: Payer: Self-pay | Admitting: Internal Medicine

## 2019-10-30 NOTE — Assessment & Plan Note (Addendum)
She is up to date on all health maintenance screenings.  age appropriate education and counseling updated, referrals for preventative services and immunizations addressed, dietary and smoking counseling addressed, most recent labs reviewed.  I have personally reviewed and have noted:  1) the patient's medical and social history 2) The pt's use of alcohol, tobacco, and illicit drugs 3) The patient's current medications and supplements 4) Functional ability including ADL's, fall risk, home safety risk, hearing and visual impairment 5) Diet and physical activities 6) Evidence for depression or mood disorder 7) The patient's height, weight, and BMI have been recorded in the chart  I have made referrals, and provided counseling and education based on review of the above

## 2019-10-30 NOTE — Assessment & Plan Note (Signed)
Diagnosed by sleep study. She is wearing her CPAP every night a minimum of 6 hours per night and notes improved daytime wakefulness and decreased fatigue  

## 2019-10-30 NOTE — Assessment & Plan Note (Signed)
Chronic with recent worsening attributed to additional emotional triggers including the recent untimely death of her sister, and the COVID pandemic. She has no daytime symptoms of anxiety (other than the recent onset of loose stools).  Did not tolerate trazodone trial.  Using an alternating regimen of ambien CBD oil and melatonin.  Refills for 6 months given

## 2019-11-16 DIAGNOSIS — D485 Neoplasm of uncertain behavior of skin: Secondary | ICD-10-CM | POA: Diagnosis not present

## 2019-11-16 DIAGNOSIS — L570 Actinic keratosis: Secondary | ICD-10-CM | POA: Diagnosis not present

## 2019-11-16 DIAGNOSIS — L719 Rosacea, unspecified: Secondary | ICD-10-CM | POA: Diagnosis not present

## 2019-11-16 DIAGNOSIS — L821 Other seborrheic keratosis: Secondary | ICD-10-CM | POA: Diagnosis not present

## 2019-11-19 ENCOUNTER — Ambulatory Visit: Payer: Self-pay | Attending: Internal Medicine

## 2019-11-19 DIAGNOSIS — Z23 Encounter for immunization: Secondary | ICD-10-CM

## 2019-11-19 NOTE — Progress Notes (Signed)
   Covid-19 Vaccination Clinic  Name:  Christina Hammond    MRN: [id number] DOB: 03/18/1951  11/19/2019  [name] was observed post Covid-19 immunization for 15 minutes without incident. She was provided with Vaccine Information Sheet and instruction to access the V-Safe system.   [name] was instructed to call 911 with any severe reactions post vaccine: Marland Kitchen Difficulty breathing  . Swelling of face and throat  . A fast heartbeat  . A bad rash all over body  . Dizziness and weakness

## 2019-11-30 DIAGNOSIS — M9902 Segmental and somatic dysfunction of thoracic region: Secondary | ICD-10-CM | POA: Diagnosis not present

## 2019-11-30 DIAGNOSIS — M9901 Segmental and somatic dysfunction of cervical region: Secondary | ICD-10-CM | POA: Diagnosis not present

## 2019-11-30 DIAGNOSIS — M62830 Muscle spasm of back: Secondary | ICD-10-CM | POA: Diagnosis not present

## 2019-11-30 DIAGNOSIS — M5134 Other intervertebral disc degeneration, thoracic region: Secondary | ICD-10-CM | POA: Diagnosis not present

## 2019-12-15 ENCOUNTER — Encounter: Payer: Self-pay | Admitting: Internal Medicine

## 2020-01-03 DIAGNOSIS — Z23 Encounter for immunization: Secondary | ICD-10-CM | POA: Diagnosis not present

## 2020-01-04 DIAGNOSIS — M8588 Other specified disorders of bone density and structure, other site: Secondary | ICD-10-CM | POA: Diagnosis not present

## 2020-01-04 DIAGNOSIS — M9902 Segmental and somatic dysfunction of thoracic region: Secondary | ICD-10-CM | POA: Diagnosis not present

## 2020-01-04 DIAGNOSIS — M9901 Segmental and somatic dysfunction of cervical region: Secondary | ICD-10-CM | POA: Diagnosis not present

## 2020-01-04 DIAGNOSIS — M62830 Muscle spasm of back: Secondary | ICD-10-CM | POA: Diagnosis not present

## 2020-01-04 DIAGNOSIS — M5134 Other intervertebral disc degeneration, thoracic region: Secondary | ICD-10-CM | POA: Diagnosis not present

## 2020-01-04 LAB — HM DEXA SCAN

## 2020-01-05 DIAGNOSIS — H900 Conductive hearing loss, bilateral: Secondary | ICD-10-CM | POA: Diagnosis not present

## 2020-01-05 DIAGNOSIS — H6123 Impacted cerumen, bilateral: Secondary | ICD-10-CM | POA: Diagnosis not present

## 2020-01-07 DIAGNOSIS — M8580 Other specified disorders of bone density and structure, unspecified site: Secondary | ICD-10-CM

## 2020-01-07 DIAGNOSIS — Z780 Asymptomatic menopausal state: Secondary | ICD-10-CM

## 2020-01-13 DIAGNOSIS — M8580 Other specified disorders of bone density and structure, unspecified site: Secondary | ICD-10-CM | POA: Insufficient documentation

## 2020-01-13 DIAGNOSIS — Z780 Asymptomatic menopausal state: Secondary | ICD-10-CM | POA: Insufficient documentation

## 2020-02-01 DIAGNOSIS — M62830 Muscle spasm of back: Secondary | ICD-10-CM | POA: Diagnosis not present

## 2020-02-01 DIAGNOSIS — M9902 Segmental and somatic dysfunction of thoracic region: Secondary | ICD-10-CM | POA: Diagnosis not present

## 2020-02-01 DIAGNOSIS — M9901 Segmental and somatic dysfunction of cervical region: Secondary | ICD-10-CM | POA: Diagnosis not present

## 2020-02-01 DIAGNOSIS — M5134 Other intervertebral disc degeneration, thoracic region: Secondary | ICD-10-CM | POA: Diagnosis not present

## 2020-02-29 DIAGNOSIS — M5134 Other intervertebral disc degeneration, thoracic region: Secondary | ICD-10-CM | POA: Diagnosis not present

## 2020-02-29 DIAGNOSIS — M62830 Muscle spasm of back: Secondary | ICD-10-CM | POA: Diagnosis not present

## 2020-02-29 DIAGNOSIS — M9902 Segmental and somatic dysfunction of thoracic region: Secondary | ICD-10-CM | POA: Diagnosis not present

## 2020-02-29 DIAGNOSIS — M9901 Segmental and somatic dysfunction of cervical region: Secondary | ICD-10-CM | POA: Diagnosis not present

## 2020-03-28 DIAGNOSIS — M62830 Muscle spasm of back: Secondary | ICD-10-CM | POA: Diagnosis not present

## 2020-03-28 DIAGNOSIS — M9901 Segmental and somatic dysfunction of cervical region: Secondary | ICD-10-CM | POA: Diagnosis not present

## 2020-03-28 DIAGNOSIS — M5134 Other intervertebral disc degeneration, thoracic region: Secondary | ICD-10-CM | POA: Diagnosis not present

## 2020-03-28 DIAGNOSIS — M9902 Segmental and somatic dysfunction of thoracic region: Secondary | ICD-10-CM | POA: Diagnosis not present

## 2020-04-17 DIAGNOSIS — D485 Neoplasm of uncertain behavior of skin: Secondary | ICD-10-CM | POA: Diagnosis not present

## 2020-04-17 DIAGNOSIS — L570 Actinic keratosis: Secondary | ICD-10-CM | POA: Diagnosis not present

## 2020-04-17 DIAGNOSIS — L219 Seborrheic dermatitis, unspecified: Secondary | ICD-10-CM | POA: Diagnosis not present

## 2020-04-25 DIAGNOSIS — M62830 Muscle spasm of back: Secondary | ICD-10-CM | POA: Diagnosis not present

## 2020-04-25 DIAGNOSIS — M5134 Other intervertebral disc degeneration, thoracic region: Secondary | ICD-10-CM | POA: Diagnosis not present

## 2020-04-25 DIAGNOSIS — M9901 Segmental and somatic dysfunction of cervical region: Secondary | ICD-10-CM | POA: Diagnosis not present

## 2020-04-25 DIAGNOSIS — M9902 Segmental and somatic dysfunction of thoracic region: Secondary | ICD-10-CM | POA: Diagnosis not present

## 2020-05-15 ENCOUNTER — Other Ambulatory Visit: Payer: Self-pay | Admitting: Internal Medicine

## 2020-05-15 MED ORDER — ZOLPIDEM TARTRATE 10 MG PO TABS: 10.0000 mg | ORAL_TABLET | Freq: Every evening | ORAL | 5 refills | Status: DC | PRN

## 2020-05-15 NOTE — Telephone Encounter (Signed)
RX Refill:ambien Last Seen:10-27-19 Last ordered:10-27-19

## 2020-05-16 ENCOUNTER — Encounter: Payer: Self-pay | Admitting: Internal Medicine

## 2020-05-16 DIAGNOSIS — H25813 Combined forms of age-related cataract, bilateral: Secondary | ICD-10-CM | POA: Diagnosis not present

## 2020-05-23 DIAGNOSIS — M62830 Muscle spasm of back: Secondary | ICD-10-CM | POA: Diagnosis not present

## 2020-05-23 DIAGNOSIS — M9902 Segmental and somatic dysfunction of thoracic region: Secondary | ICD-10-CM | POA: Diagnosis not present

## 2020-05-23 DIAGNOSIS — M9901 Segmental and somatic dysfunction of cervical region: Secondary | ICD-10-CM | POA: Diagnosis not present

## 2020-05-23 DIAGNOSIS — M5134 Other intervertebral disc degeneration, thoracic region: Secondary | ICD-10-CM | POA: Diagnosis not present

## 2020-06-01 NOTE — Telephone Encounter (Signed)
Pt called and wanted to know if someone could call her back about previous MyChart messages. Please advise

## 2020-06-02 ENCOUNTER — Other Ambulatory Visit (INDEPENDENT_AMBULATORY_CARE_PROVIDER_SITE_OTHER): Payer: Medicare Other

## 2020-06-02 ENCOUNTER — Other Ambulatory Visit: Payer: Self-pay

## 2020-06-02 DIAGNOSIS — R300 Dysuria: Secondary | ICD-10-CM

## 2020-06-02 DIAGNOSIS — B9620 Unspecified Escherichia coli [E. coli] as the cause of diseases classified elsewhere: Secondary | ICD-10-CM

## 2020-06-02 DIAGNOSIS — N390 Urinary tract infection, site not specified: Secondary | ICD-10-CM

## 2020-06-02 LAB — URINALYSIS, ROUTINE W REFLEX MICROSCOPIC
Bilirubin Urine: NEGATIVE
Ketones, ur: NEGATIVE
Nitrite: NEGATIVE
Specific Gravity, Urine: <=1.005 — AB (ref 1.000–1.030)
Total Protein, Urine: 30 — AB
Urine Glucose: NEGATIVE
Urobilinogen, UA: 0.2 (ref 0.0–1.0)
pH: 6.5 (ref 5.0–8.0)

## 2020-06-02 NOTE — Telephone Encounter (Signed)
Pt added to lab schedule this morning for a urine sample.  [facility] FUTURE LAB ORDERS

## 2020-06-04 DIAGNOSIS — N390 Urinary tract infection, site not specified: Secondary | ICD-10-CM | POA: Insufficient documentation

## 2020-06-04 DIAGNOSIS — B9620 Unspecified Escherichia coli [E. coli] as the cause of diseases classified elsewhere: Secondary | ICD-10-CM | POA: Insufficient documentation

## 2020-06-04 LAB — URINE CULTURE
MICRO NUMBER:: 11664981
SPECIMEN QUALITY:: ADEQUATE

## 2020-06-04 MED ORDER — CIPROFLOXACIN HCL 250 MG PO TABS: 250.0000 mg | ORAL_TABLET | Freq: Two times a day (BID) | ORAL | 0 refills | Status: DC

## 2020-06-04 NOTE — Addendum Note (Signed)
Addended by: Crecencio Mc on: 06/04/2020 09:12 PM   Modules accepted: Orders

## 2020-06-05 NOTE — Telephone Encounter (Signed)
  Your urine culture is growing E Coli that should respond to ciprofloxacin. I am calling it in to your pharmacy'  Regards,   Deborra Medina, MD Written by Crecencio Mc, MD on 06/04/2020 9:11 PM EDT Seen by patient Christina Hammond on 06/04/2020 9:19 PM    Thus far, There is no evidence of UTI by urinalysis., just inflammation.  However the culture is still pending And will take another 24 hours. I recommend using AZO for the pain /irritation you are having and I will send another message when the culture is resulted, which will be 24 to 48 hours.  if the culture comes back negative and the pain persists, I would recommend seeing a urologist to have the lining of your bladder examined with cystoscopy.  If your symptoms are limited to burning, please make an appointment with me or your gynecologist for a pelvic exam, because this may be due to atrophic or other forms of vaginitis .  Regards,   Deborra Medina, MD Written by Crecencio Mc, MD on 06/02/2020 4:15 PM EDT Seen by patient Christina Hammond on 06/04/2020 9:19 PM

## 2020-06-13 DIAGNOSIS — M9901 Segmental and somatic dysfunction of cervical region: Secondary | ICD-10-CM | POA: Diagnosis not present

## 2020-06-13 DIAGNOSIS — M9902 Segmental and somatic dysfunction of thoracic region: Secondary | ICD-10-CM | POA: Diagnosis not present

## 2020-06-13 DIAGNOSIS — M5134 Other intervertebral disc degeneration, thoracic region: Secondary | ICD-10-CM | POA: Diagnosis not present

## 2020-06-13 DIAGNOSIS — M62830 Muscle spasm of back: Secondary | ICD-10-CM | POA: Diagnosis not present

## 2020-06-27 ENCOUNTER — Other Ambulatory Visit: Payer: Self-pay | Admitting: Internal Medicine

## 2020-06-27 DIAGNOSIS — Z1231 Encounter for screening mammogram for malignant neoplasm of breast: Secondary | ICD-10-CM

## 2020-07-05 DIAGNOSIS — M9902 Segmental and somatic dysfunction of thoracic region: Secondary | ICD-10-CM | POA: Diagnosis not present

## 2020-07-05 DIAGNOSIS — M5134 Other intervertebral disc degeneration, thoracic region: Secondary | ICD-10-CM | POA: Diagnosis not present

## 2020-07-05 DIAGNOSIS — H903 Sensorineural hearing loss, bilateral: Secondary | ICD-10-CM | POA: Diagnosis not present

## 2020-07-05 DIAGNOSIS — H6123 Impacted cerumen, bilateral: Secondary | ICD-10-CM | POA: Diagnosis not present

## 2020-07-05 DIAGNOSIS — M9901 Segmental and somatic dysfunction of cervical region: Secondary | ICD-10-CM | POA: Diagnosis not present

## 2020-07-05 DIAGNOSIS — M62830 Muscle spasm of back: Secondary | ICD-10-CM | POA: Diagnosis not present

## 2020-07-10 ENCOUNTER — Other Ambulatory Visit: Payer: Self-pay | Admitting: Internal Medicine

## 2020-07-10 DIAGNOSIS — Z1231 Encounter for screening mammogram for malignant neoplasm of breast: Secondary | ICD-10-CM

## 2020-07-12 ENCOUNTER — Other Ambulatory Visit: Payer: Self-pay

## 2020-07-12 ENCOUNTER — Ambulatory Visit
Admission: RE | Admit: 2020-07-12 | Discharge: 2020-07-12 | Disposition: A | Discharge status: Home / Self Care | Payer: Medicare Other | Source: Ambulatory Visit | Attending: Internal Medicine | Admitting: Internal Medicine

## 2020-07-12 DIAGNOSIS — Z1231 Encounter for screening mammogram for malignant neoplasm of breast: Secondary | ICD-10-CM

## 2020-07-13 DIAGNOSIS — Z23 Encounter for immunization: Secondary | ICD-10-CM | POA: Diagnosis not present

## 2020-07-24 NOTE — Telephone Encounter (Signed)
Called patient back and advised could not diagnose from pictures and through my chart, patient says face normal today. I advised she should be evaluated , she stated she is out of town and does not want to go now but she stated she would go if this happens again. Advised patient again she should be evaluated and she refused unless symptoms worsen or return.

## 2020-07-24 NOTE — Telephone Encounter (Signed)
Patient says she has had skin cancer surgeries on the right side  X 3 ,  The left side her eye is drooping, patient says this been like this a few days ago when she first noticed.  She said the yesterday she took another picture because in that picture for a little while it looked normal. Patient stated she has had no other symptoms no tingling or numbness , no headaches or difficulties in talking or swallowing. Patient is currently out of town until Saturday.  I advised patient any new symptoms or worsening symptoms to be evaluated sooner. Ask patient to send Third Pic from yesterday where she says things look normal to her so she attached a third Picture which is from today. I have printed labeled with dates attached.

## 2020-08-01 DIAGNOSIS — M5134 Other intervertebral disc degeneration, thoracic region: Secondary | ICD-10-CM | POA: Diagnosis not present

## 2020-08-01 DIAGNOSIS — M9902 Segmental and somatic dysfunction of thoracic region: Secondary | ICD-10-CM | POA: Diagnosis not present

## 2020-08-01 DIAGNOSIS — M9901 Segmental and somatic dysfunction of cervical region: Secondary | ICD-10-CM | POA: Diagnosis not present

## 2020-08-01 DIAGNOSIS — M62830 Muscle spasm of back: Secondary | ICD-10-CM | POA: Diagnosis not present

## 2020-08-29 DIAGNOSIS — M9902 Segmental and somatic dysfunction of thoracic region: Secondary | ICD-10-CM | POA: Diagnosis not present

## 2020-08-29 DIAGNOSIS — M9901 Segmental and somatic dysfunction of cervical region: Secondary | ICD-10-CM | POA: Diagnosis not present

## 2020-08-29 DIAGNOSIS — M62830 Muscle spasm of back: Secondary | ICD-10-CM | POA: Diagnosis not present

## 2020-08-29 DIAGNOSIS — M5134 Other intervertebral disc degeneration, thoracic region: Secondary | ICD-10-CM | POA: Diagnosis not present

## 2020-09-22 ENCOUNTER — Ambulatory Visit: Payer: PRIVATE HEALTH INSURANCE

## 2020-09-26 DIAGNOSIS — M9901 Segmental and somatic dysfunction of cervical region: Secondary | ICD-10-CM | POA: Diagnosis not present

## 2020-09-26 DIAGNOSIS — M9902 Segmental and somatic dysfunction of thoracic region: Secondary | ICD-10-CM | POA: Diagnosis not present

## 2020-09-26 DIAGNOSIS — M5134 Other intervertebral disc degeneration, thoracic region: Secondary | ICD-10-CM | POA: Diagnosis not present

## 2020-09-26 DIAGNOSIS — M62830 Muscle spasm of back: Secondary | ICD-10-CM | POA: Diagnosis not present

## 2020-09-29 ENCOUNTER — Ambulatory Visit (INDEPENDENT_AMBULATORY_CARE_PROVIDER_SITE_OTHER): Payer: Medicare Other

## 2020-09-29 VITALS — Ht 67.0 in | Wt 165.0 lb

## 2020-09-29 DIAGNOSIS — Z1211 Encounter for screening for malignant neoplasm of colon: Secondary | ICD-10-CM | POA: Diagnosis not present

## 2020-09-29 DIAGNOSIS — Z0000 Encounter for general adult medical examination without abnormal findings: Secondary | ICD-10-CM | POA: Diagnosis not present

## 2020-09-29 NOTE — Progress Notes (Signed)
Subjective:   Christina Hammond is a 70 y.o. female who presents for Medicare Annual/Subsequent preventive examination.  Review of Systems    No ROS.  Medicare Wellness Virtual Visit.  Visual/audio telehealth visit, UTA vital signs.   See social history for additional risk factors.         Objective:    Today's Vitals   09/29/20 1451  Weight: 165 lb (74.8 kg)  Height: 5\' 7"  (1.702 m)   Body mass index is 25.84 kg/m.  Advanced Directives 09/29/2020 09/22/2019 09/21/2018 09/16/2017  Does Patient Have a Medical Advance Directive? Yes Yes Yes Yes  Type of Paramedic of [place];Living will Living will;Healthcare Power of [place];Living will [place]  Does patient want to make changes to medical advance directive? No - Patient declined No - Patient declined No - Patient declined No - Patient declined  Copy of [place] in Chart? No - copy requested No - copy requested No - copy requested -    Current Medications (verified) Outpatient Encounter Medications as of 09/29/2020  Medication Sig   Melatonin 10 MG TABS Take 2 tablets by mouth daily.   mometasone (NASONEX) 50 MCG/ACT nasal spray Place 2 sprays into the nose daily.   omeprazole (PRILOSEC) 20 MG capsule Take 20 mg by mouth daily.   zolpidem (AMBIEN) 10 MG tablet Take 1 tablet (10 mg total) by mouth at bedtime as needed. for sleep   [DISCONTINUED] ciprofloxacin (CIPRO) 250 MG tablet Take 1 tablet (250 mg total) by mouth 2 (two) times daily.   No facility-administered encounter medications on file as of 09/29/2020.    Allergies (verified) Diclofenac and Ibuprofen   History: Past Medical History:  Diagnosis Date   Cancer ([place])    basal cell,  [facility]    Restless legs syndrome (RLS)    managed with minimal meds   Past Surgical History:  Procedure Laterality Date   ABDOMINAL HYSTERECTOMY  1979   APPENDECTOMY  1956   CESAREAN  SECTION     CHOLECYSTECTOMY     Family History  Problem Relation Age of Onset   Diabetes Mother    Stroke Mother    Cancer Father    Heart disease Father    Cancer Maternal Aunt    Mental retardation Maternal Aunt    Alcohol abuse Maternal Uncle    Breast cancer Cousin    Social History   Socioeconomic History   Marital status: Married    Spouse name: Not on file   Number of children: Not on file   Years of education: Not on file   Highest education level: Not on file  Occupational History   Occupation: real estate agent  Tobacco Use   Smoking status: Never   Smokeless tobacco: Never  Substance and Sexual Activity   Alcohol use: Yes    Alcohol/week: 1.0 - 2.0 standard drink    Types: 1 - 2 Glasses of wine per week    Comment: weekends, social    Drug use: No   Sexual activity: Not on file  Other Topics Concern   Not on file  Social History Narrative   ** Merged History Encounter **       Social Determinants of Health   Financial Resource Strain: Low Risk    Difficulty of Paying Living Expenses: Not hard at all  Food Insecurity: No Food Insecurity   Worried About Charity fundraiser in the Last  Year: Never true   [place] in the Last Year: Never true  Transportation Needs: No Transportation Needs   Lack of Transportation (Medical): No   Lack of Transportation (Non-Medical): No  Physical Activity: Insufficiently Active   Days of Exercise per Week: 3 days   Minutes of Exercise per Session: 30 min  Stress: No Stress Concern Present   Feeling of Stress : Not at all  Social Connections: Unknown   Frequency of Communication with Friends and Family: Not on file   Frequency of Social Gatherings with Friends and Family: Not on file   Attends Religious Services: Not on Electrical engineer or Organizations: Not on file   Attends Archivist Meetings: Not on file   Marital Status: Married    Tobacco Counseling Counseling given: Not  Answered   Clinical Intake:  Pre-visit preparation completed: Yes        Diabetes: No  How often do you need to have someone help you when you read instructions, pamphlets, or other written materials from your doctor or pharmacy?: 1 - Never    Interpreter Needed?: No      Activities of Daily Living In your present state of health, do you have any difficulty performing the following activities: 09/29/2020  Hearing? N  Vision? N  Difficulty concentrating or making decisions? N  Walking or climbing stairs? N  Dressing or bathing? N  Doing errands, shopping? N  Preparing Food and eating ? N  Using the Toilet? N  In the past six months, have you accidently leaked urine? N  Do you have problems with loss of bowel control? N  Managing your Medications? N  Managing your Finances? N  Housekeeping or managing your Housekeeping? N  Some recent data might be hidden    Patient Care Team: Crecencio Mc, MD as PCP - General (Internal Medicine)  Indicate any recent Medical Services you may have received from other than [facility] providers in the past year (date may be approximate).     Assessment:   This is a routine wellness examination for Christina Hammond.  I connected with [place] today by telephone and verified that I am speaking with the correct person using two identifiers. Location patient: home Location provider: work Persons participating in the virtual visit: patient, Marine scientist.    I discussed the limitations, risks, security and privacy concerns of performing an evaluation and management service by telephone and the availability of in person appointments. The patient expressed understanding and verbally consented to this telephonic visit.    Interactive audio and video telecommunications were attempted between this provider and patient, however failed, due to patient having technical difficulties OR patient did not have access to video capability.  We continued and completed  visit with audio only.  Some vital signs may be absent or patient reported.   Hearing/Vision screen Hearing Screening - Comments:: Patient is able to hear conversational tones without difficulty. No issues reported.  Vision Screening - Comments:: [facility]  Wears corrective lenses  They have seen their ophthalmologist in the last 12 months.   Dietary issues and exercise activities discussed:   Healthy diet Good water intake   Goals Addressed   None    Depression Screen PHQ 2/9 Scores 09/29/2020 09/22/2019 09/21/2018 09/16/2017 04/02/2017 01/30/2016 08/11/2014  PHQ - 2 Score 0 0 0 0 0 0 0  PHQ- 9 Score - - - - 0 - -    Fall  Risk Fall Risk  09/29/2020 10/27/2019 09/22/2019 09/21/2018 09/16/2017  Falls in the past year? 0 0 0 0 No  Number falls in past yr: 0 - 0 - -  Injury with Fall? 0 - - - -  Follow up Falls evaluation completed Falls evaluation completed Falls evaluation completed - -   ASSISTIVE DEVICES UTILIZED TO PREVENT FALLS:  Life alert? No  Use of a cane, walker or w/c? No   TIMED UP AND GO: Was the test performed? No .   Cognitive Function: Patient is alert and oriented x3. Denies difficulty focusing, making decisions, memory loss.  MMSE/6CIT deferred. Normal by direct communication/observation.  MMSE - Mini Mental State Exam 09/22/2019 09/16/2017  Not completed: Unable to complete -  Orientation to time - 5  Orientation to Place - 5  Registration - 3  Attention/ Calculation - 5  Recall - 3  Language- name 2 objects - 2  Language- repeat - 1  Language- follow 3 step command - 3  Language- read & follow direction - 1  Write a sentence - 1  Copy design - 1  Total score - 30     6CIT Screen 09/21/2018  What Year? 0 points  What month? 0 points  What time? 0 points  Count back from 20 0 points  Months in reverse 0 points  Repeat phrase 0 points  Total Score 0    Immunizations Immunization History  Administered Date(s) Administered   Fluad  Quad(high Dose 65+) 11/10/2018   Influenza Split 01/10/2012, 12/16/2013   Influenza, High Dose Seasonal PF 01/10/2017   Influenza, Seasonal, Injecte, Preservative Fre 11/25/2014   Influenza,inj,Quad PF,6+ Mos 12/10/2012   Influenza-Unspecified 01/18/2016, 01/12/2018   PFIZER(Purple Top)SARS-COV-2 Vaccination 04/06/2019, 04/26/2019, 11/19/2019, 07/13/2020   Pneumococcal Conjugate-13 07/07/2013   Pneumococcal Polysaccharide-23 01/10/2017   Tdap 10/11/2011   Zoster, Live 01/26/2014   Health Maintenance Health Maintenance  Topic Date Due   Fecal DNA (Cologuard)  08/06/2020   Zoster Vaccines- Shingrix (1 of 2) 12/30/2020 (Originally 04/24/1969)   INFLUENZA VACCINE  10/16/2020   COVID-19 Vaccine (5 - Booster for Pfizer series) 11/12/2020   MAMMOGRAM  07/12/2021   TETANUS/TDAP  10/10/2021   DEXA SCAN  Completed   Hepatitis C Screening  Completed   PNA vac Low Risk Adult  Completed   HPV VACCINES  Aged Out   Colorectal cancer screening: Type of screening: Cologuard. Completed 08/03/20. Repeat every 3 years Ordered per consent.   Lung Cancer Screening: (Low Dose CT Chest recommended if Age 31-80 years, 30 pack-year currently smoking OR have quit w/in 15years.) does not qualify.   Dental Screening: Recommended annual dental exams for proper oral hygiene  Community Resource Referral / Chronic Care Management: CRR required this visit?  No   CCM required this visit?  No      Plan:   Keep all routine maintenance appointments.   I have personally reviewed and noted the following in the patient's chart:   Medical and social history Use of alcohol, tobacco or illicit drugs  Current medications and supplements including opioid prescriptions. Patient is not currently taking opioid prescriptions. Functional ability and status Nutritional status Physical activity Advanced directives List of other physicians Hospitalizations, surgeries, and ER visits in previous 12  months Vitals Screenings to include cognitive, depression, and falls Referrals and appointments  In addition, I have reviewed and discussed with patient certain preventive protocols, quality metrics, and best practice recommendations. A written personalized care plan for preventive services as well  as general preventive health recommendations were provided to patient via mychart.     Varney Biles, LPN   [id number]

## 2020-09-29 NOTE — Patient Instructions (Addendum)
Christina Hammond , Thank you for taking time to come for your Medicare Wellness Visit. I appreciate your ongoing commitment to your health goals. Please review the following plan we discussed and let me know if I can assist you in the future.   These are the goals we discussed:  Goals      Maintain Healthy Lifestyle     Stay active Stay hydrated Healthy diet        This is a list of the screening recommended for you and due dates:  Health Maintenance  Topic Date Due   Cologuard (Stool DNA test)  08/06/2020   Zoster (Shingles) Vaccine (1 of 2) 12/30/2020*   Flu Shot  10/16/2020   COVID-19 Vaccine (5 - Booster for Pfizer series) 11/12/2020   Mammogram  07/12/2021   Tetanus Vaccine  10/10/2021   DEXA scan (bone density measurement)  Completed   Hepatitis C Screening: USPSTF Recommendation to screen - Ages 64-79 yo.  Completed   Pneumonia vaccines  Completed   HPV Vaccine  Aged Out  *Topic was postponed. The date shown is not the original due date.    Advanced directives: End of life planning; Advance aging; Advanced directives discussed.  Copy of current HCPOA/Living Will requested.    Conditions/risks identified: none new  Follow up in one year for your annual wellness visit    Preventive Care 65 Years and Older, Female Preventive care refers to lifestyle choices and visits with your health care provider that can promote health and wellness. What does preventive care include? A yearly physical exam. This is also called an annual well check. Dental exams once or twice a year. Routine eye exams. Ask your health care provider how often you should have your eyes checked. Personal lifestyle choices, including: Daily care of your teeth and gums. Regular physical activity. Eating a healthy diet. Avoiding tobacco and drug use. Limiting alcohol use. Practicing safe sex. Taking low-dose aspirin every day. Taking vitamin and mineral supplements as recommended by your health care  provider. What happens during an annual well check? The services and screenings done by your health care provider during your annual well check will depend on your age, overall health, lifestyle risk factors, and family history of disease. Counseling  Your health care provider may ask you questions about your: Alcohol use. Tobacco use. Drug use. Emotional well-being. Home and relationship well-being. Sexual activity. Eating habits. History of falls. Memory and ability to understand (cognition). Work and work Statistician. Reproductive health. Screening  You may have the following tests or measurements: Height, weight, and BMI. Blood pressure. Lipid and cholesterol levels. These may be checked every 5 years, or more frequently if you are over 66 years old. Skin check. Lung cancer screening. You may have this screening every year starting at age 25 if you have a 30-pack-year history of smoking and currently smoke or have quit within the past 15 years. Fecal occult blood test (FOBT) of the stool. You may have this test every year starting at age 37. Flexible sigmoidoscopy or colonoscopy. You may have a sigmoidoscopy every 5 years or a colonoscopy every 10 years starting at age 68. Hepatitis C blood test. Hepatitis B blood test. Sexually transmitted disease (STD) testing. Diabetes screening. This is done by checking your blood sugar (glucose) after you have not eaten for a while (fasting). You may have this done every 1-3 years. Bone density scan. This is done to screen for osteoporosis. You may have this done starting at age  65. Mammogram. This may be done every 1-2 years. Talk to your health care provider about how often you should have regular mammograms. Talk with your health care provider about your test results, treatment options, and if necessary, the need for more tests. Vaccines  Your health care provider may recommend certain vaccines, such as: Influenza vaccine. This is  recommended every year. Tetanus, diphtheria, and acellular pertussis (Tdap, Td) vaccine. You may need a Td booster every 10 years. Zoster vaccine. You may need this after age 81. Pneumococcal 13-valent conjugate (PCV13) vaccine. One dose is recommended after age 71. Pneumococcal polysaccharide (PPSV23) vaccine. One dose is recommended after age 36. Talk to your health care provider about which screenings and vaccines you need and how often you need them. This information is not intended to replace advice given to you by your health care provider. Make sure you discuss any questions you have with your health care provider. Document Released: 03/31/2015 Document Revised: 11/22/2015 Document Reviewed: 01/03/2015 Elsevier Interactive Patient Education  2017 [place] Prevention in the Home Falls can cause injuries. They can happen to people of all ages. There are many things you can do to make your home safe and to help prevent falls. What can I do on the outside of my home? Regularly fix the edges of walkways and driveways and fix any cracks. Remove anything that might make you trip as you walk through a door, such as a raised step or threshold. Trim any bushes or trees on the path to your home. Use bright outdoor lighting. Clear any walking paths of anything that might make someone trip, such as rocks or tools. Regularly check to see if handrails are loose or broken. Make sure that both sides of any steps have handrails. Any raised decks and porches should have guardrails on the edges. Have any leaves, snow, or ice cleared regularly. Use sand or salt on walking paths during winter. Clean up any spills in your garage right away. This includes oil or grease spills. What can I do in the bathroom? Use night lights. Install grab bars by the toilet and in the tub and shower. Do not use towel bars as grab bars. Use non-skid mats or decals in the tub or shower. If you need to sit down in  the shower, use a plastic, non-slip stool. Keep the floor dry. Clean up any water that spills on the floor as soon as it happens. Remove soap buildup in the tub or shower regularly. Attach bath mats securely with double-sided non-slip rug tape. Do not have throw rugs and other things on the floor that can make you trip. What can I do in the bedroom? Use night lights. Make sure that you have a light by your bed that is easy to reach. Do not use any sheets or blankets that are too big for your bed. They should not hang down onto the floor. Have a firm chair that has side arms. You can use this for support while you get dressed. Do not have throw rugs and other things on the floor that can make you trip. What can I do in the kitchen? Clean up any spills right away. Avoid walking on wet floors. Keep items that you use a lot in easy-to-reach places. If you need to reach something above you, use a strong step stool that has a grab bar. Keep electrical cords out of the way. Do not use floor polish or wax that makes floors slippery.  If you must use wax, use non-skid floor wax. Do not have throw rugs and other things on the floor that can make you trip. What can I do with my stairs? Do not leave any items on the stairs. Make sure that there are handrails on both sides of the stairs and use them. Fix handrails that are broken or loose. Make sure that handrails are as long as the stairways. Check any carpeting to make sure that it is firmly attached to the stairs. Fix any carpet that is loose or worn. Avoid having throw rugs at the top or bottom of the stairs. If you do have throw rugs, attach them to the floor with carpet tape. Make sure that you have a light switch at the top of the stairs and the bottom of the stairs. If you do not have them, ask someone to add them for you. What else can I do to help prevent falls? Wear shoes that: Do not have high heels. Have rubber bottoms. Are comfortable  and fit you well. Are closed at the toe. Do not wear sandals. If you use a stepladder: Make sure that it is fully opened. Do not climb a closed stepladder. Make sure that both sides of the stepladder are locked into place. Ask someone to hold it for you, if possible. Clearly mark and make sure that you can see: Any grab bars or handrails. First and last steps. Where the edge of each step is. Use tools that help you move around (mobility aids) if they are needed. These include: Canes. Walkers. Scooters. Crutches. Turn on the lights when you go into a dark area. Replace any light bulbs as soon as they burn out. Set up your furniture so you have a clear path. Avoid moving your furniture around. If any of your floors are uneven, fix them. If there are any pets around you, be aware of where they are. Review your medicines with your doctor. Some medicines can make you feel dizzy. This can increase your chance of falling. Ask your doctor what other things that you can do to help prevent falls. This information is not intended to replace advice given to you by your health care provider. Make sure you discuss any questions you have with your health care provider. Document Released: 12/29/2008 Document Revised: 08/10/2015 Document Reviewed: 04/08/2014 Elsevier Interactive Patient Education  2017 [employer].

## 2020-10-24 DIAGNOSIS — M9901 Segmental and somatic dysfunction of cervical region: Secondary | ICD-10-CM | POA: Diagnosis not present

## 2020-10-24 DIAGNOSIS — M62830 Muscle spasm of back: Secondary | ICD-10-CM | POA: Diagnosis not present

## 2020-10-24 DIAGNOSIS — Z20822 Contact with and (suspected) exposure to covid-19: Secondary | ICD-10-CM | POA: Diagnosis not present

## 2020-10-24 DIAGNOSIS — M9902 Segmental and somatic dysfunction of thoracic region: Secondary | ICD-10-CM | POA: Diagnosis not present

## 2020-10-24 DIAGNOSIS — M5134 Other intervertebral disc degeneration, thoracic region: Secondary | ICD-10-CM | POA: Diagnosis not present

## 2020-10-25 ENCOUNTER — Telehealth: Payer: Self-pay | Admitting: Internal Medicine

## 2020-10-25 MED ORDER — MOLNUPIRAVIR EUA 200MG CAPSULE: 4.0000 | ORAL_CAPSULE | Freq: Two times a day (BID) | ORAL | 0 refills | Status: AC

## 2020-10-25 NOTE — Telephone Encounter (Signed)
Patient informed, Due to the high volume of calls and your symptoms we have to forward your call to our Triage Nurse to expedient your call. Please hold for the transfer.  Patient transferred to Access Nurse. Due to testing positive for COVID today and is unsure what to do next.No openings in office or virtual.

## 2020-10-25 NOTE — Telephone Encounter (Signed)
Paxlovid cannot be prescribed because seh has not had kidney function assessed in the last year.  Prescribing molnupirovir which is comparable and safer .  Rx sent to [facility]

## 2020-10-25 NOTE — Telephone Encounter (Signed)
Please see previous message. Access nurse instructed patient to call pcp and receive paxlovid. Also instructed on home care.

## 2020-10-25 NOTE — Telephone Encounter (Signed)
Patient has been notified

## 2020-10-25 NOTE — Telephone Encounter (Signed)
Pt said that Access Nurse told her to call back an ask for paxlovid to be called in

## 2020-10-26 DIAGNOSIS — R300 Dysuria: Secondary | ICD-10-CM

## 2020-10-26 NOTE — Telephone Encounter (Signed)
Left message for patient to return call back.  

## 2020-10-26 NOTE — Telephone Encounter (Signed)
Please see previous message below.

## 2020-10-26 NOTE — Telephone Encounter (Signed)
Patient's husband called and said patient was prescribed Lagevrio '200mg'$ , 2x a day, 10/1020. Patient is feeling worse, threw up yellow stuff, tired, body aches, cough. No available appointments. Patient was transferred to [facility] at Access Nurse.

## 2020-10-26 NOTE — Telephone Encounter (Signed)
Pt's husband called and states that we are supposed to call his cell-not hers. He is upset because pt just got to sleep and we called and woke her up. Do not call her phone number. Call him at [phone]. I tried to reach Azzel and Fransisco Beau but no answer-please return call.

## 2020-11-01 DIAGNOSIS — U071 COVID-19: Secondary | ICD-10-CM | POA: Diagnosis not present

## 2020-11-07 NOTE — Telephone Encounter (Signed)
Spoke with pt and advised her that she does not need to continue testing herself for covid because she will continue to test positive for up to 90 days. Pt gave a verbal understanding. Her covid symptoms have gotten better but then this morning pt had slight burning and urgency to urinate but has not had those symptoms since this morning. Pt was advised that if her urinary symptoms return to please give the office a call.

## 2020-11-08 DIAGNOSIS — Z1211 Encounter for screening for malignant neoplasm of colon: Secondary | ICD-10-CM

## 2020-11-16 DIAGNOSIS — Z1211 Encounter for screening for malignant neoplasm of colon: Secondary | ICD-10-CM | POA: Diagnosis not present

## 2020-11-21 DIAGNOSIS — M9902 Segmental and somatic dysfunction of thoracic region: Secondary | ICD-10-CM | POA: Diagnosis not present

## 2020-11-21 DIAGNOSIS — M5134 Other intervertebral disc degeneration, thoracic region: Secondary | ICD-10-CM | POA: Diagnosis not present

## 2020-11-21 DIAGNOSIS — M62830 Muscle spasm of back: Secondary | ICD-10-CM | POA: Diagnosis not present

## 2020-11-21 DIAGNOSIS — M9901 Segmental and somatic dysfunction of cervical region: Secondary | ICD-10-CM | POA: Diagnosis not present

## 2020-11-23 LAB — COLOGUARD: Cologuard: NEGATIVE

## 2020-12-16 DIAGNOSIS — Z20822 Contact with and (suspected) exposure to covid-19: Secondary | ICD-10-CM | POA: Diagnosis not present

## 2020-12-17 DIAGNOSIS — U071 COVID-19: Secondary | ICD-10-CM | POA: Diagnosis not present

## 2020-12-26 DIAGNOSIS — M9901 Segmental and somatic dysfunction of cervical region: Secondary | ICD-10-CM | POA: Diagnosis not present

## 2020-12-26 DIAGNOSIS — Z23 Encounter for immunization: Secondary | ICD-10-CM | POA: Diagnosis not present

## 2020-12-26 DIAGNOSIS — M9902 Segmental and somatic dysfunction of thoracic region: Secondary | ICD-10-CM | POA: Diagnosis not present

## 2020-12-26 DIAGNOSIS — M62830 Muscle spasm of back: Secondary | ICD-10-CM | POA: Diagnosis not present

## 2020-12-26 DIAGNOSIS — M5134 Other intervertebral disc degeneration, thoracic region: Secondary | ICD-10-CM | POA: Diagnosis not present

## 2021-01-16 DIAGNOSIS — Z20822 Contact with and (suspected) exposure to covid-19: Secondary | ICD-10-CM | POA: Diagnosis not present

## 2021-01-23 DIAGNOSIS — M9902 Segmental and somatic dysfunction of thoracic region: Secondary | ICD-10-CM | POA: Diagnosis not present

## 2021-01-23 DIAGNOSIS — M5134 Other intervertebral disc degeneration, thoracic region: Secondary | ICD-10-CM | POA: Diagnosis not present

## 2021-01-23 DIAGNOSIS — M9901 Segmental and somatic dysfunction of cervical region: Secondary | ICD-10-CM | POA: Diagnosis not present

## 2021-01-23 DIAGNOSIS — M62830 Muscle spasm of back: Secondary | ICD-10-CM | POA: Diagnosis not present

## 2021-02-20 DIAGNOSIS — M62830 Muscle spasm of back: Secondary | ICD-10-CM | POA: Diagnosis not present

## 2021-02-20 DIAGNOSIS — M5134 Other intervertebral disc degeneration, thoracic region: Secondary | ICD-10-CM | POA: Diagnosis not present

## 2021-02-20 DIAGNOSIS — M9901 Segmental and somatic dysfunction of cervical region: Secondary | ICD-10-CM | POA: Diagnosis not present

## 2021-02-20 DIAGNOSIS — M9902 Segmental and somatic dysfunction of thoracic region: Secondary | ICD-10-CM | POA: Diagnosis not present

## 2021-02-27 DIAGNOSIS — M62830 Muscle spasm of back: Secondary | ICD-10-CM | POA: Diagnosis not present

## 2021-02-27 DIAGNOSIS — M9902 Segmental and somatic dysfunction of thoracic region: Secondary | ICD-10-CM | POA: Diagnosis not present

## 2021-02-27 DIAGNOSIS — M5134 Other intervertebral disc degeneration, thoracic region: Secondary | ICD-10-CM | POA: Diagnosis not present

## 2021-02-27 DIAGNOSIS — M9901 Segmental and somatic dysfunction of cervical region: Secondary | ICD-10-CM | POA: Diagnosis not present

## 2021-03-20 DIAGNOSIS — M62830 Muscle spasm of back: Secondary | ICD-10-CM | POA: Diagnosis not present

## 2021-03-20 DIAGNOSIS — M5416 Radiculopathy, lumbar region: Secondary | ICD-10-CM | POA: Diagnosis not present

## 2021-03-20 DIAGNOSIS — M9903 Segmental and somatic dysfunction of lumbar region: Secondary | ICD-10-CM | POA: Diagnosis not present

## 2021-03-20 DIAGNOSIS — M9901 Segmental and somatic dysfunction of cervical region: Secondary | ICD-10-CM | POA: Diagnosis not present

## 2021-03-28 DIAGNOSIS — M5416 Radiculopathy, lumbar region: Secondary | ICD-10-CM | POA: Diagnosis not present

## 2021-03-28 DIAGNOSIS — M62830 Muscle spasm of back: Secondary | ICD-10-CM | POA: Diagnosis not present

## 2021-03-28 DIAGNOSIS — M9901 Segmental and somatic dysfunction of cervical region: Secondary | ICD-10-CM | POA: Diagnosis not present

## 2021-03-28 DIAGNOSIS — M9903 Segmental and somatic dysfunction of lumbar region: Secondary | ICD-10-CM | POA: Diagnosis not present

## 2021-04-07 DIAGNOSIS — Z23 Encounter for immunization: Secondary | ICD-10-CM | POA: Diagnosis not present

## 2021-04-10 DIAGNOSIS — M9903 Segmental and somatic dysfunction of lumbar region: Secondary | ICD-10-CM | POA: Diagnosis not present

## 2021-04-10 DIAGNOSIS — M9901 Segmental and somatic dysfunction of cervical region: Secondary | ICD-10-CM | POA: Diagnosis not present

## 2021-04-10 DIAGNOSIS — M5416 Radiculopathy, lumbar region: Secondary | ICD-10-CM | POA: Diagnosis not present

## 2021-04-10 DIAGNOSIS — M62830 Muscle spasm of back: Secondary | ICD-10-CM | POA: Diagnosis not present

## 2021-05-02 DIAGNOSIS — M9903 Segmental and somatic dysfunction of lumbar region: Secondary | ICD-10-CM | POA: Diagnosis not present

## 2021-05-02 DIAGNOSIS — M62830 Muscle spasm of back: Secondary | ICD-10-CM | POA: Diagnosis not present

## 2021-05-02 DIAGNOSIS — M9901 Segmental and somatic dysfunction of cervical region: Secondary | ICD-10-CM | POA: Diagnosis not present

## 2021-05-02 DIAGNOSIS — M5416 Radiculopathy, lumbar region: Secondary | ICD-10-CM | POA: Diagnosis not present

## 2021-05-10 DIAGNOSIS — U071 COVID-19: Secondary | ICD-10-CM | POA: Diagnosis not present

## 2021-05-18 DIAGNOSIS — D485 Neoplasm of uncertain behavior of skin: Secondary | ICD-10-CM | POA: Diagnosis not present

## 2021-05-18 DIAGNOSIS — L538 Other specified erythematous conditions: Secondary | ICD-10-CM | POA: Diagnosis not present

## 2021-05-18 DIAGNOSIS — D225 Melanocytic nevi of trunk: Secondary | ICD-10-CM | POA: Diagnosis not present

## 2021-05-18 DIAGNOSIS — L570 Actinic keratosis: Secondary | ICD-10-CM | POA: Diagnosis not present

## 2021-05-18 DIAGNOSIS — L814 Other melanin hyperpigmentation: Secondary | ICD-10-CM | POA: Diagnosis not present

## 2021-05-18 DIAGNOSIS — L718 Other rosacea: Secondary | ICD-10-CM | POA: Diagnosis not present

## 2021-05-18 DIAGNOSIS — Z08 Encounter for follow-up examination after completed treatment for malignant neoplasm: Secondary | ICD-10-CM | POA: Diagnosis not present

## 2021-05-18 DIAGNOSIS — L820 Inflamed seborrheic keratosis: Secondary | ICD-10-CM | POA: Diagnosis not present

## 2021-05-18 DIAGNOSIS — Z85828 Personal history of other malignant neoplasm of skin: Secondary | ICD-10-CM | POA: Diagnosis not present

## 2021-05-18 DIAGNOSIS — L821 Other seborrheic keratosis: Secondary | ICD-10-CM | POA: Diagnosis not present

## 2021-05-28 DIAGNOSIS — H25813 Combined forms of age-related cataract, bilateral: Secondary | ICD-10-CM | POA: Diagnosis not present

## 2021-05-29 DIAGNOSIS — M5416 Radiculopathy, lumbar region: Secondary | ICD-10-CM | POA: Diagnosis not present

## 2021-05-29 DIAGNOSIS — M62830 Muscle spasm of back: Secondary | ICD-10-CM | POA: Diagnosis not present

## 2021-05-29 DIAGNOSIS — M9901 Segmental and somatic dysfunction of cervical region: Secondary | ICD-10-CM | POA: Diagnosis not present

## 2021-05-29 DIAGNOSIS — M9903 Segmental and somatic dysfunction of lumbar region: Secondary | ICD-10-CM | POA: Diagnosis not present

## 2021-06-26 DIAGNOSIS — M9903 Segmental and somatic dysfunction of lumbar region: Secondary | ICD-10-CM | POA: Diagnosis not present

## 2021-06-26 DIAGNOSIS — M5416 Radiculopathy, lumbar region: Secondary | ICD-10-CM | POA: Diagnosis not present

## 2021-06-26 DIAGNOSIS — M62830 Muscle spasm of back: Secondary | ICD-10-CM | POA: Diagnosis not present

## 2021-06-26 DIAGNOSIS — M9901 Segmental and somatic dysfunction of cervical region: Secondary | ICD-10-CM | POA: Diagnosis not present

## 2021-07-24 DIAGNOSIS — M62830 Muscle spasm of back: Secondary | ICD-10-CM | POA: Diagnosis not present

## 2021-07-24 DIAGNOSIS — M9903 Segmental and somatic dysfunction of lumbar region: Secondary | ICD-10-CM | POA: Diagnosis not present

## 2021-07-24 DIAGNOSIS — M5416 Radiculopathy, lumbar region: Secondary | ICD-10-CM | POA: Diagnosis not present

## 2021-07-24 DIAGNOSIS — M9901 Segmental and somatic dysfunction of cervical region: Secondary | ICD-10-CM | POA: Diagnosis not present

## 2021-07-25 ENCOUNTER — Other Ambulatory Visit: Payer: Self-pay | Admitting: Internal Medicine

## 2021-07-25 DIAGNOSIS — Z1231 Encounter for screening mammogram for malignant neoplasm of breast: Secondary | ICD-10-CM

## 2021-07-31 ENCOUNTER — Ambulatory Visit
Admission: RE | Admit: 2021-07-31 | Discharge: 2021-07-31 | Disposition: A | Discharge status: Home / Self Care | Payer: Medicare Other | Source: Ambulatory Visit | Attending: Internal Medicine | Admitting: Internal Medicine

## 2021-07-31 DIAGNOSIS — Z1231 Encounter for screening mammogram for malignant neoplasm of breast: Secondary | ICD-10-CM | POA: Diagnosis not present

## 2021-08-01 DIAGNOSIS — H6123 Impacted cerumen, bilateral: Secondary | ICD-10-CM | POA: Diagnosis not present

## 2021-08-01 DIAGNOSIS — H902 Conductive hearing loss, unspecified: Secondary | ICD-10-CM | POA: Diagnosis not present

## 2021-08-01 DIAGNOSIS — H903 Sensorineural hearing loss, bilateral: Secondary | ICD-10-CM | POA: Diagnosis not present

## 2021-08-02 ENCOUNTER — Other Ambulatory Visit: Payer: Self-pay | Admitting: Unknown Physician Specialty

## 2021-08-02 DIAGNOSIS — H903 Sensorineural hearing loss, bilateral: Secondary | ICD-10-CM

## 2021-08-10 ENCOUNTER — Other Ambulatory Visit: Payer: Medicare Other

## 2021-08-14 ENCOUNTER — Ambulatory Visit
Admission: RE | Admit: 2021-08-14 | Discharge: 2021-08-14 | Disposition: A | Discharge status: Home / Self Care | Payer: Medicare Other | Source: Ambulatory Visit | Attending: Unknown Physician Specialty | Admitting: Unknown Physician Specialty

## 2021-08-14 DIAGNOSIS — H903 Sensorineural hearing loss, bilateral: Secondary | ICD-10-CM | POA: Diagnosis not present

## 2021-08-14 MED ORDER — GADOBENATE DIMEGLUMINE 529 MG/ML IV SOLN
15.0000 mL | Freq: Once | INTRAVENOUS | Status: AC | PRN
  Administered 2021-08-14: 15 mL via INTRAVENOUS

## 2021-08-21 DIAGNOSIS — M62830 Muscle spasm of back: Secondary | ICD-10-CM | POA: Diagnosis not present

## 2021-08-21 DIAGNOSIS — M9903 Segmental and somatic dysfunction of lumbar region: Secondary | ICD-10-CM | POA: Diagnosis not present

## 2021-08-21 DIAGNOSIS — M5416 Radiculopathy, lumbar region: Secondary | ICD-10-CM | POA: Diagnosis not present

## 2021-08-21 DIAGNOSIS — M9901 Segmental and somatic dysfunction of cervical region: Secondary | ICD-10-CM | POA: Diagnosis not present

## 2021-09-03 DIAGNOSIS — H02402 Unspecified ptosis of left eyelid: Secondary | ICD-10-CM | POA: Diagnosis not present

## 2021-09-03 DIAGNOSIS — H2513 Age-related nuclear cataract, bilateral: Secondary | ICD-10-CM | POA: Diagnosis not present

## 2021-09-17 DIAGNOSIS — M9901 Segmental and somatic dysfunction of cervical region: Secondary | ICD-10-CM | POA: Diagnosis not present

## 2021-09-17 DIAGNOSIS — M62830 Muscle spasm of back: Secondary | ICD-10-CM | POA: Diagnosis not present

## 2021-09-17 DIAGNOSIS — M9903 Segmental and somatic dysfunction of lumbar region: Secondary | ICD-10-CM | POA: Diagnosis not present

## 2021-09-17 DIAGNOSIS — M5416 Radiculopathy, lumbar region: Secondary | ICD-10-CM | POA: Diagnosis not present

## 2021-10-16 DIAGNOSIS — M9901 Segmental and somatic dysfunction of cervical region: Secondary | ICD-10-CM | POA: Diagnosis not present

## 2021-10-16 DIAGNOSIS — M62830 Muscle spasm of back: Secondary | ICD-10-CM | POA: Diagnosis not present

## 2021-10-16 DIAGNOSIS — M5416 Radiculopathy, lumbar region: Secondary | ICD-10-CM | POA: Diagnosis not present

## 2021-10-16 DIAGNOSIS — M9903 Segmental and somatic dysfunction of lumbar region: Secondary | ICD-10-CM | POA: Diagnosis not present

## 2021-11-13 DIAGNOSIS — M62830 Muscle spasm of back: Secondary | ICD-10-CM | POA: Diagnosis not present

## 2021-11-13 DIAGNOSIS — M5416 Radiculopathy, lumbar region: Secondary | ICD-10-CM | POA: Diagnosis not present

## 2021-11-13 DIAGNOSIS — M9901 Segmental and somatic dysfunction of cervical region: Secondary | ICD-10-CM | POA: Diagnosis not present

## 2021-11-13 DIAGNOSIS — M9903 Segmental and somatic dysfunction of lumbar region: Secondary | ICD-10-CM | POA: Diagnosis not present

## 2021-12-11 DIAGNOSIS — M62830 Muscle spasm of back: Secondary | ICD-10-CM | POA: Diagnosis not present

## 2021-12-11 DIAGNOSIS — M9901 Segmental and somatic dysfunction of cervical region: Secondary | ICD-10-CM | POA: Diagnosis not present

## 2021-12-11 DIAGNOSIS — M9903 Segmental and somatic dysfunction of lumbar region: Secondary | ICD-10-CM | POA: Diagnosis not present

## 2021-12-11 DIAGNOSIS — M5416 Radiculopathy, lumbar region: Secondary | ICD-10-CM | POA: Diagnosis not present

## 2021-12-24 DIAGNOSIS — Z23 Encounter for immunization: Secondary | ICD-10-CM | POA: Diagnosis not present

## 2022-01-08 DIAGNOSIS — M62830 Muscle spasm of back: Secondary | ICD-10-CM | POA: Diagnosis not present

## 2022-01-08 DIAGNOSIS — M9901 Segmental and somatic dysfunction of cervical region: Secondary | ICD-10-CM | POA: Diagnosis not present

## 2022-01-08 DIAGNOSIS — M5416 Radiculopathy, lumbar region: Secondary | ICD-10-CM | POA: Diagnosis not present

## 2022-01-08 DIAGNOSIS — M9903 Segmental and somatic dysfunction of lumbar region: Secondary | ICD-10-CM | POA: Diagnosis not present

## 2022-01-12 ENCOUNTER — Other Ambulatory Visit: Payer: Self-pay | Admitting: Internal Medicine

## 2022-01-12 ENCOUNTER — Encounter: Payer: Self-pay | Admitting: Internal Medicine

## 2022-01-15 NOTE — Telephone Encounter (Signed)
Spoke with pt and scheduled her an appt for next week. Pt would like to know if there is anything she can take to help with sleep until her her appt.

## 2022-01-22 ENCOUNTER — Telehealth (INDEPENDENT_AMBULATORY_CARE_PROVIDER_SITE_OTHER): Payer: Medicare Other | Admitting: Internal Medicine

## 2022-01-22 ENCOUNTER — Encounter: Payer: Self-pay | Admitting: Internal Medicine

## 2022-01-22 VITALS — BP 131/77 | Ht 67.0 in | Wt 164.0 lb

## 2022-01-22 DIAGNOSIS — R5383 Other fatigue: Secondary | ICD-10-CM | POA: Diagnosis not present

## 2022-01-22 DIAGNOSIS — R7301 Impaired fasting glucose: Secondary | ICD-10-CM | POA: Diagnosis not present

## 2022-01-22 DIAGNOSIS — F5101 Primary insomnia: Secondary | ICD-10-CM | POA: Diagnosis not present

## 2022-01-22 DIAGNOSIS — G4733 Obstructive sleep apnea (adult) (pediatric): Secondary | ICD-10-CM | POA: Diagnosis not present

## 2022-01-22 DIAGNOSIS — E785 Hyperlipidemia, unspecified: Secondary | ICD-10-CM | POA: Diagnosis not present

## 2022-01-22 DIAGNOSIS — G2581 Restless legs syndrome: Secondary | ICD-10-CM

## 2022-01-22 MED ORDER — ESZOPICLONE 2 MG PO TABS: 2.0000 mg | ORAL_TABLET | Freq: Every evening | ORAL | 1 refills | Status: DC | PRN

## 2022-01-22 NOTE — Assessment & Plan Note (Signed)
No longer present

## 2022-01-22 NOTE — Assessment & Plan Note (Signed)
Diagnosed by sleep study. She is wearing her CPAP every night a minimum of 6 hours per night and notes improved daytime wakefulness and decreased fatigue  

## 2022-01-22 NOTE — Progress Notes (Signed)
Virtual Visit via [place]   Note    This format is felt to be most appropriate for this patient at this time.  All issues noted in this document were discussed and addressed.  No physical exam was performed (except for noted visual exam findings with Video Visits).   I connected withNAME on 01/22/22 at  4:30 PM EST by a video enabled telemedicine application or telephone and verified that I am speaking with the correct person using two identifiers. Location patient: home Location provider: work or home office Persons participating in the virtual visit: patient, provider  I discussed the limitations, risks, security and privacy concerns of performing an evaluation and management service by telephone and the availability of in person appointments. I also discussed with the patient that there may be a patient responsible charge related to this service. The patient expressed understanding and agreed to proceed.   Reason for visit: insomnia  HPI:  71 yr old female with chronic insomnia previously managed with ambien, but not for the last year.  Has been through trials of trazodone CBG , melatonin and Relaxium  Which caused transient neuropathy fo feet feet, , and he is currenlty using tylenol PM .  She is averaging 3 hours of sleep before she wakes up  several times per night.   She denies anxiety as a source for her anxiety.  She spends a considerable part of her day in meditation and positive thinking,  and is walking for 45 minutes daily.    ROS: See pertinent positives and negatives per HPI.  Past Medical History:  Diagnosis Date   Cancer ([place])    basal cell,  [facility]    Restless legs syndrome (RLS)    managed with minimal meds    Past Surgical History:  Procedure Laterality Date   ABDOMINAL HYSTERECTOMY  1979   APPENDECTOMY  1956   CESAREAN SECTION     CHOLECYSTECTOMY      Family History  Problem Relation Age of Onset   Diabetes Mother    Stroke Mother    Cancer Father     Heart disease Father    Cancer Maternal Aunt    Mental retardation Maternal Aunt    Alcohol abuse Maternal Uncle    Breast cancer Cousin     SOCIAL HX:  reports that she has never smoked. She has never used smokeless tobacco. She reports current alcohol use of about 1.0 - 2.0 standard drink of alcohol per week. She reports that she does not use drugs.    Current Outpatient Medications:    acetaminophen (TYLENOL) 325 MG tablet, Take 325 mg by mouth at bedtime., Disp: , Rfl:    diphenhydrAMINE (BENADRYL) 25 mg capsule, Take 25 mg by mouth at bedtime., Disp: , Rfl:    eszopiclone (LUNESTA) 2 MG TABS tablet, Take 1 tablet (2 mg total) by mouth at bedtime as needed for sleep. Take immediately before bedtime, Disp: 30 tablet, Rfl: 1   mometasone (NASONEX) 50 MCG/ACT nasal spray, Place 2 sprays into the nose daily., Disp: 17 g, Rfl: 12   omeprazole (PRILOSEC) 20 MG capsule, Take 20 mg by mouth daily., Disp: , Rfl:   EXAM:  VITALS per patient if applicable:  GENERAL: alert, oriented, appears well and in no acute distress  HEENT: atraumatic, conjunttiva clear, no obvious abnormalities on inspection of external nose and ears  NECK: normal movements of the head and neck  LUNGS: on inspection no signs of respiratory distress, breathing rate appears normal,  no obvious gross SOB, gasping or wheezing  CV: no obvious cyanosis  MS: moves all visible extremities without noticeable abnormality  PSYCH/NEURO: pleasant and cooperative, no obvious depression or anxiety, speech and thought processing grossly intact  ASSESSMENT AND PLAN:  Discussed the following assessment and plan:  Other fatigue - Plan: TSH, CBC with Differential/Platelet  Impaired fasting glucose - Plan: Comprehensive metabolic panel, Hemoglobin A1c  Hyperlipidemia, unspecified hyperlipidemia type - Plan: Lipid panel, LDL cholesterol, direct  Primary insomnia  OSA (obstructive sleep apnea)  Restless legs syndrome  (RLS)  Insomnia Trial of lunesta .  2 mg dose written; advised to cut in half   OSA (obstructive sleep apnea) Diagnosed by sleep study. She is wearing her CPAP every night a minimum of 6 hours per night and notes improved daytime wakefulness and decreased fatigue   Restless legs syndrome (RLS) No longer present     I discussed the assessment and treatment plan with the patient. The patient was provided an opportunity to ask questions and all were answered. The patient agreed with the plan and demonstrated an understanding of the instructions.   The patient was advised to call back or seek an in-person evaluation if the symptoms worsen or if the condition fails to improve as anticipated.   I spent 30 minutes dedicated to the care of this patient on the date of this encounter to include pre-visit review of his medical history,  Face-to-face time with the patient , and post visit ordering of testing and therapeutics.    Crecencio Mc, MD

## 2022-01-22 NOTE — Assessment & Plan Note (Signed)
Trial of lunesta .  2 mg dose written; advised to cut in half

## 2022-01-23 ENCOUNTER — Telehealth: Payer: Self-pay

## 2022-01-23 NOTE — Telephone Encounter (Signed)
PA for Johnnye Sima has been submitted on covermymeds.

## 2022-01-24 NOTE — Telephone Encounter (Signed)
PA Denial letter received

## 2022-01-24 NOTE — Telephone Encounter (Signed)
The Christina Hammond has been denied by insurance. I put in the PA all the other medications that she has tried for insomnia.

## 2022-01-25 MED ORDER — BELSOMRA 5 MG PO TABS: 5.0000 mg | ORAL_TABLET | Freq: Every evening | ORAL | 3 refills | Status: DC | PRN

## 2022-01-25 NOTE — Telephone Encounter (Signed)
Alternative medication , belsomra,  sent to pharmacy (per coverage letter)

## 2022-01-25 NOTE — Telephone Encounter (Signed)
Spoke with pt and she stated that she looked on goodrx and she is able to get the medication from [facility] at reasonable price. She stated that she left a message with [facility] to see if they could have [facility] trasfer the rx to them.

## 2022-01-25 NOTE — Addendum Note (Signed)
Addended by: Crecencio Mc on: 01/25/2022 01:08 AM   Modules accepted: Orders

## 2022-01-30 MED ORDER — BELSOMRA 5 MG PO TABS: 5.0000 mg | ORAL_TABLET | Freq: Every evening | ORAL | 0 refills | Status: DC | PRN

## 2022-01-30 MED ORDER — ESZOPICLONE 2 MG PO TABS: 2.0000 mg | ORAL_TABLET | Freq: Every evening | ORAL | 5 refills | Status: DC | PRN

## 2022-01-30 NOTE — Telephone Encounter (Signed)
A new rqty #90 has been sent to [facility]

## 2022-02-05 DIAGNOSIS — M5416 Radiculopathy, lumbar region: Secondary | ICD-10-CM | POA: Diagnosis not present

## 2022-02-05 DIAGNOSIS — M9901 Segmental and somatic dysfunction of cervical region: Secondary | ICD-10-CM | POA: Diagnosis not present

## 2022-02-05 DIAGNOSIS — M9903 Segmental and somatic dysfunction of lumbar region: Secondary | ICD-10-CM | POA: Diagnosis not present

## 2022-02-05 DIAGNOSIS — M62830 Muscle spasm of back: Secondary | ICD-10-CM | POA: Diagnosis not present

## 2022-02-12 ENCOUNTER — Other Ambulatory Visit (INDEPENDENT_AMBULATORY_CARE_PROVIDER_SITE_OTHER): Payer: Medicare Other

## 2022-02-12 DIAGNOSIS — R7301 Impaired fasting glucose: Secondary | ICD-10-CM

## 2022-02-12 DIAGNOSIS — E785 Hyperlipidemia, unspecified: Secondary | ICD-10-CM | POA: Diagnosis not present

## 2022-02-12 DIAGNOSIS — R5383 Other fatigue: Secondary | ICD-10-CM | POA: Diagnosis not present

## 2022-02-12 DIAGNOSIS — E782 Mixed hyperlipidemia: Secondary | ICD-10-CM

## 2022-02-12 LAB — COMPREHENSIVE METABOLIC PANEL
ALT: 19 U/L (ref 0–35)
AST: 17 U/L (ref 0–37)
Albumin: 4.1 g/dL (ref 3.5–5.2)
Alkaline Phosphatase: 54 U/L (ref 39–117)
BUN: 10 mg/dL (ref 6–23)
CO2: 31 mEq/L (ref 19–32)
Calcium: 8.9 mg/dL (ref 8.4–10.5)
Chloride: 104 mEq/L (ref 96–112)
Creatinine, Ser: 0.66 mg/dL (ref 0.40–1.20)
GFR: 86.79 mL/min (ref >60.00)
Glucose, Bld: 96 mg/dL (ref 70–99)
Potassium: 4.2 mEq/L (ref 3.5–5.1)
Sodium: 142 mEq/L (ref 135–145)
Total Bilirubin: 0.4 mg/dL (ref 0.2–1.2)
Total Protein: 6.5 g/dL (ref 6.0–8.3)

## 2022-02-12 LAB — HEMOGLOBIN A1C: Hgb A1c MFr Bld: 6.2 % (ref 4.6–6.5)

## 2022-02-12 LAB — CBC WITH DIFFERENTIAL/PLATELET
Basophils Absolute: 0 10*3/uL (ref 0.0–0.1)
Basophils Relative: 0.9 % (ref 0.0–3.0)
Eosinophils Absolute: 0.1 10*3/uL (ref 0.0–0.7)
Eosinophils Relative: 2.3 % (ref 0.0–5.0)
HCT: 40.7 % (ref 36.0–46.0)
Hemoglobin: 13.5 g/dL (ref 12.0–15.0)
Lymphocytes Relative: 35.7 % (ref 12.0–46.0)
Lymphs Abs: 1.5 10*3/uL (ref 0.7–4.0)
MCHC: 33.1 g/dL (ref 30.0–36.0)
MCV: 89.3 fl (ref 78.0–100.0)
Monocytes Absolute: 0.3 10*3/uL (ref 0.1–1.0)
Monocytes Relative: 7.8 % (ref 3.0–12.0)
Neutro Abs: 2.3 10*3/uL (ref 1.4–7.7)
Neutrophils Relative %: 53.3 % (ref 43.0–77.0)
Platelets: 291 10*3/uL (ref 150.0–400.0)
RBC: 4.56 Mil/uL (ref 3.87–5.11)
RDW: 13.8 % (ref 11.5–15.5)
WBC: 4.3 10*3/uL (ref 4.0–10.5)

## 2022-02-12 LAB — TSH: TSH: 2.11 u[IU]/mL (ref 0.35–5.50)

## 2022-02-12 LAB — LIPID PANEL
Cholesterol: 250 mg/dL — ABNORMAL HIGH (ref 0–200)
HDL: 63 mg/dL (ref >39.00)
LDL Cholesterol: 160 mg/dL — ABNORMAL HIGH (ref 0–99)
NonHDL: 186.63
Total CHOL/HDL Ratio: 4
Triglycerides: 134 mg/dL (ref 0.0–149.0)
VLDL: 26.8 mg/dL (ref 0.0–40.0)

## 2022-02-12 LAB — LDL CHOLESTEROL, DIRECT: Direct LDL: 157 mg/dL

## 2022-02-13 NOTE — Assessment & Plan Note (Signed)
10 yr risk has risen to 14%.  Treatment advised  Lab Results  Component Value Date   CHOL 250 (H) 02/12/2022   HDL 63.00 02/12/2022   LDLCALC 160 (H) 02/12/2022   LDLDIRECT 157.0 02/12/2022   TRIG 134.0 02/12/2022   CHOLHDL 4 02/12/2022

## 2022-02-21 ENCOUNTER — Encounter: Payer: Self-pay | Admitting: Internal Medicine

## 2022-02-21 ENCOUNTER — Ambulatory Visit (INDEPENDENT_AMBULATORY_CARE_PROVIDER_SITE_OTHER): Payer: Medicare Other | Admitting: Internal Medicine

## 2022-02-21 VITALS — BP 129/79 | HR 87 | Temp 97.6°F | Ht 67.0 in | Wt 168.8 lb

## 2022-02-21 DIAGNOSIS — F5101 Primary insomnia: Secondary | ICD-10-CM

## 2022-02-21 DIAGNOSIS — E782 Mixed hyperlipidemia: Secondary | ICD-10-CM

## 2022-02-21 MED ORDER — ALPRAZOLAM 0.25 MG PO TABS: 0.2500 mg | ORAL_TABLET | Freq: Two times a day (BID) | ORAL | 0 refills | Status: DC | PRN

## 2022-02-21 NOTE — Patient Instructions (Addendum)
No more LUNESTA!   I am prescribing generic XANAX.  Do not take it if you are sleepy when you go to bed.  Take a dose only if you are awake > 20 minutes .  Start with 1/2 to 1 tablet  max dose 2 tablets.    If you fall asleep without it,  but wake up 2 hours later,  you can take your dose then, or anytime before 3 am.    For your cholesteor,  you can do a trial of red Yeast Rice as a natural remedy, along with a low glycemic index diet,    The natural remedies for cholesterol have not been proven to reduce your risk for a heart attack.  Red Yeast Rice has not been proven either,  But it  does lower cholesterol, so if you want to try it , the dose is 600 mg twice daily in capsule form, available OTC.  It does require monitoring of liver enzymes,  Just like the statins,  So if you decide to start it,  I would like you to return in 6 weeks for non fasting labs, and 6 months for fasting labs.

## 2022-02-21 NOTE — Progress Notes (Signed)
Subjective:  Patient ID: Christina Hammond, female    DOB: [birth year]  Age: 71 y.o. 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 A diagnosis of Mixed hyperlipidemia was also pertinent to this visit.   HPI Christina Hammond presents for  Chief Complaint  Patient presents with   Follow-up    Follow up on medication    Christina Hammond was seen in  November for hte first time since November 2021.  The purpose of the visit was to discuss  management of chronic insomnia .  She was previously [place] dependent, but the medication was stopped due to being lost to follow up since 2021.  She has been  trying multiple natural supplements without success and most recently with side effects of burning feet during a trial of relaxium.  She was prescribed lunesta at her visit in  November 2023 as a trial , which she has stopped taking due to  dry throat and cough,  which eventually resulted in a prolonged nosebleed.  She is avoiding use of I pad and blu lit devices for 2 hours prior to bedtime and has one glass of wine with dinner occasionally.  She has some nights when she falls asleep readily but wakes up 1-2 hours later and is wide awake   hyperlipidemia    Outpatient Medications Prior to Visit  Medication Sig Dispense Refill   acetaminophen (TYLENOL) 325 MG tablet Take 325 mg by mouth at bedtime.     diphenhydrAMINE (BENADRYL) 25 mg capsule Take 25 mg by mouth at bedtime.     mometasone (NASONEX) 50 MCG/ACT nasal spray Place 2 sprays into the nose daily. 17 g 12   omeprazole (PRILOSEC) 20 MG capsule Take 20 mg by mouth daily.     eszopiclone (LUNESTA) 2 MG TABS tablet Take 1 tablet (2 mg total) by mouth at bedtime as needed for sleep. Take immediately before bedtime (Patient not taking: Reported on 02/21/2022) 30 tablet 5   No facility-administered medications prior to visit.    Review of Systems;  Patient denies headache, fevers, malaise, unintentional weight loss,  skin rash, eye pain, sinus congestion and sinus pain, sore throat, dysphagia,  hemoptysis , cough, dyspnea, wheezing, chest pain, palpitations, orthopnea, edema, abdominal pain, nausea, melena, diarrhea, constipation, flank pain, dysuria, hematuria, urinary  Frequency, nocturia, numbness, tingling, seizures,  Focal weakness, Loss of consciousness,  Tremor, insomnia, depression, anxiety, and suicidal ideation.      Objective:  BP 129/79   Pulse 87   Temp 97.6 F (36.4 C) (Oral)   Ht '5\' 7"'$  (1.702 m)   Wt 168 lb 12.8 oz (76.6 kg)   SpO2 98%   BMI 26.44 kg/m   BP Readings from Last 3 Encounters:  02/21/22 129/79  01/22/22 131/77  10/27/19 125/80    Wt Readings from Last 3 Encounters:  02/21/22 168 lb 12.8 oz (76.6 kg)  01/22/22 164 lb (74.4 kg)  09/29/20 165 lb (74.8 kg)    General appearance: alert, cooperative and appears stated age Ears: normal TM's and external ear canals both ears Throat: lips, mucosa, and tongue normal; teeth and gums normal Neck: no adenopathy, no carotid bruit, supple, symmetrical, trachea midline and thyroid not enlarged, symmetric, no tenderness/mass/nodules Back: symmetric, no curvature. ROM normal. No CVA tenderness. Lungs: clear to auscultation bilaterally Heart: regular rate and rhythm, S1, S2 normal, no murmur, click, rub or gallop Abdomen: soft, non-tender; bowel sounds normal; no masses,  no organomegaly Pulses: 2+ and symmetric  Skin: Skin color, texture, turgor normal. No rashes or lesions Lymph nodes: Cervical, supraclavicular, and axillary nodes normal. Neuro:  awake and interactive with normal mood and affect. Higher cortical functions are normal. Speech is clear without word-finding difficulty or dysarthria. Extraocular movements are intact. Visual fields of both eyes are grossly intact. Sensation to light touch is grossly intact bilaterally of upper and lower extremities. Motor examination shows 4+/5 symmetric hand grip and upper extremity and  5/5 lower extremity strength. There is no pronation or drift. Gait is non-ataxic   Lab Results  Component Value Date   HGBA1C 6.2 02/12/2022    Lab Results  Component Value Date   CREATININE 0.66 02/12/2022   CREATININE 0.78 04/02/2017   CREATININE 0.63 02/26/2016    Lab Results  Component Value Date   WBC 4.3 02/12/2022   HGB 13.5 02/12/2022   HCT 40.7 02/12/2022   PLT 291.0 02/12/2022   GLUCOSE 96 02/12/2022   CHOL 250 (H) 02/12/2022   TRIG 134.0 02/12/2022   HDL 63.00 02/12/2022   LDLDIRECT 157.0 02/12/2022   LDLCALC 160 (H) 02/12/2022   ALT 19 02/12/2022   AST 17 02/12/2022   NA 142 02/12/2022   K 4.2 02/12/2022   CL 104 02/12/2022   CREATININE 0.66 02/12/2022   BUN 10 02/12/2022   CO2 31 02/12/2022   TSH 2.11 02/12/2022   HGBA1C 6.2 02/12/2022    MR BRAIN/IAC W WO CONTRAST  Result Date: 08/14/2021 CLINICAL DATA:  Provided history: Sensorineural hearing loss, bilateral. Hearing loss, left drooping eye. Additional history provided by scanning technologist: Patient reports drooping of left eye, hearing loss (left greater than right). EXAM: MRI HEAD WITHOUT AND WITH CONTRAST TECHNIQUE: Multiplanar, multiecho pulse sequences of the brain and surrounding structures were obtained without and with intravenous contrast. CONTRAST:  65m MULTIHANCE GADOBENATE DIMEGLUMINE 529 MG/ML IV SOLN COMPARISON:  Report from brain MRI 12/09/2001 (images unavailable). FINDINGS: Mild intermittent motion degradation. Brain: No age advanced or lobar predominant parenchymal atrophy. 3 mm focus of T2 FLAIR hyperintense signal abnormality within the anterolateral right frontal lobe white matter, nonspecific and of doubtful clinical significance as an isolated white matter focus. Prominent perivascular space within the left basal ganglia. Partially empty sella turcica. No evidence of an intracranial mass. Specifically, no cerebellopontine angle or internal auditory canal mass is demonstrated.  Unremarkable appearance of the 7th and 8th cranial nerves bilaterally. There is no acute infarct. No evidence of an intracranial mass. No chronic intracranial blood products. No extra-axial fluid collection. No midline shift. No pathologic intracranial enhancement identified. Vascular: Maintained flow voids within the proximal large arterial vessels. Skull and upper cervical spine: No focal suspicious marrow lesion. Sinuses/Orbits: No mass or acute finding within the imaged orbits. Minimal mucosal thickening and fluid scattered within the bilateral ethmoid air cells. IMPRESSION: Mildly motion degraded exam. No evidence of acute intracranial abnormality. No cerebellopontine angle or internal auditory canal mass. Mild bilateral ethmoid sinusitis. Electronically Signed   By: KKellie SimmeringD.O.   On: 08/14/2021 19:19    Assessment & Plan:   Problem List Items Addressed This Visit     Hyperlipidemia    10 yr risk has risen to 14%.  Treatment advised but she prefers to use natural remedies.  Red Yeast rice discussed.  She has no evidence of AA pr CVD on prior imaging studies   Lab Results  Component Value Date   CHOL 250 (H) 02/12/2022   HDL 63.00 02/12/2022   LDLCALC 160 (H) 02/12/2022  LDLDIRECT 157.0 02/12/2022   TRIG 134.0 02/12/2022   CHOLHDL 4 02/12/2022         Insomnia - Primary    CBD started causing paranoia ; lunesta caused severe xerostomia  and nosebleeds.  Trial of 0.25 mg alprazolam for prn use. The risks and benefits of benzodiazepine use were discussed with patient today including excessive sedation leading to respiratory depression,  impaired thinking/driving, and addiction.  Patient was advised to avoid concurrent use with alcohol, to use medication only as needed and not to share with others  .          I spent a total of  30 minutes with this patient in a face to face visit on the date of this encounter reviewing the last office visit with me in  November,   patient's diet  and exercise habits, home blood pressure readings,  and post visit ordering of testing and therapeutics.    Follow-up: No follow-ups on file.   Crecencio Mc, MD

## 2022-02-21 NOTE — Assessment & Plan Note (Addendum)
CBD started causing paranoia ; lunesta caused severe xerostomia  and nosebleeds.  Trial of 0.25 mg alprazolam for prn use. The risks and benefits of benzodiazepine use were discussed with patient today including excessive sedation leading to respiratory depression,  impaired thinking/driving, and addiction.  Patient was advised to avoid concurrent use with alcohol, to use medication only as needed and not to share with others  .

## 2022-02-21 NOTE — Assessment & Plan Note (Signed)
10 yr risk has risen to 14%.  Treatment advised but she prefers to use natural remedies.  Red Yeast rice discussed.  She has no evidence of AA pr CVD on prior imaging studies   Lab Results  Component Value Date   CHOL 250 (H) 02/12/2022   HDL 63.00 02/12/2022   LDLCALC 160 (H) 02/12/2022   LDLDIRECT 157.0 02/12/2022   TRIG 134.0 02/12/2022   CHOLHDL 4 02/12/2022

## 2022-02-26 ENCOUNTER — Encounter: Payer: Self-pay | Admitting: Internal Medicine

## 2022-03-01 MED ORDER — ALPRAZOLAM 0.25 MG PO TABS: 0.2500 mg | ORAL_TABLET | Freq: Two times a day (BID) | ORAL | 0 refills | Status: DC | PRN

## 2022-03-05 DIAGNOSIS — M62830 Muscle spasm of back: Secondary | ICD-10-CM | POA: Diagnosis not present

## 2022-03-05 DIAGNOSIS — M9903 Segmental and somatic dysfunction of lumbar region: Secondary | ICD-10-CM | POA: Diagnosis not present

## 2022-03-05 DIAGNOSIS — M9901 Segmental and somatic dysfunction of cervical region: Secondary | ICD-10-CM | POA: Diagnosis not present

## 2022-03-05 DIAGNOSIS — M5416 Radiculopathy, lumbar region: Secondary | ICD-10-CM | POA: Diagnosis not present

## 2022-03-14 ENCOUNTER — Encounter: Payer: Self-pay | Admitting: Internal Medicine

## 2022-03-14 NOTE — Telephone Encounter (Signed)
Pt advised needs to be evaluated. No open appointments in office until next week.  Pt advised should go to [facility] across the street from office.  Pt agreeable, stated will go over now.

## 2022-03-15 ENCOUNTER — Other Ambulatory Visit: Payer: Self-pay | Admitting: Internal Medicine

## 2022-03-15 DIAGNOSIS — J0100 Acute maxillary sinusitis, unspecified: Secondary | ICD-10-CM | POA: Diagnosis not present

## 2022-03-15 DIAGNOSIS — R509 Fever, unspecified: Secondary | ICD-10-CM | POA: Diagnosis not present

## 2022-03-15 DIAGNOSIS — R059 Cough, unspecified: Secondary | ICD-10-CM | POA: Diagnosis not present

## 2022-03-15 MED ORDER — AMOXICILLIN-POT CLAVULANATE 875-125 MG PO TABS: 1.0000 | ORAL_TABLET | Freq: Two times a day (BID) | ORAL | 0 refills | Status: DC

## 2022-03-15 MED ORDER — ONDANSETRON HCL 4 MG PO TABS: 4.0000 mg | ORAL_TABLET | Freq: Three times a day (TID) | ORAL | 0 refills | Status: DC | PRN

## 2022-03-26 ENCOUNTER — Encounter: Payer: Self-pay | Admitting: Internal Medicine

## 2022-03-28 ENCOUNTER — Encounter: Payer: Self-pay | Admitting: Internal Medicine

## 2022-04-02 DIAGNOSIS — M9903 Segmental and somatic dysfunction of lumbar region: Secondary | ICD-10-CM | POA: Diagnosis not present

## 2022-04-02 DIAGNOSIS — M9901 Segmental and somatic dysfunction of cervical region: Secondary | ICD-10-CM | POA: Diagnosis not present

## 2022-04-02 DIAGNOSIS — M62830 Muscle spasm of back: Secondary | ICD-10-CM | POA: Diagnosis not present

## 2022-04-02 DIAGNOSIS — M5416 Radiculopathy, lumbar region: Secondary | ICD-10-CM | POA: Diagnosis not present

## 2022-04-03 ENCOUNTER — Encounter: Payer: Self-pay | Admitting: Internal Medicine

## 2022-04-04 ENCOUNTER — Encounter: Payer: Self-pay | Admitting: Nurse Practitioner

## 2022-04-04 ENCOUNTER — Telehealth (INDEPENDENT_AMBULATORY_CARE_PROVIDER_SITE_OTHER): Payer: Medicare Other | Admitting: Nurse Practitioner

## 2022-04-04 VITALS — Ht 67.0 in | Wt 166.0 lb

## 2022-04-04 DIAGNOSIS — U071 COVID-19: Secondary | ICD-10-CM | POA: Diagnosis not present

## 2022-04-04 MED ORDER — PSEUDOEPH-BROMPHEN-DM 30-2-10 MG/5ML PO SYRP: 5.0000 mL | ORAL_SOLUTION | Freq: Four times a day (QID) | ORAL | 0 refills | Status: DC | PRN

## 2022-04-04 MED ORDER — NIRMATRELVIR/RITONAVIR (PAXLOVID)TABLET: 3.0000 | ORAL_TABLET | Freq: Two times a day (BID) | ORAL | 0 refills | Status: AC

## 2022-04-04 NOTE — Assessment & Plan Note (Signed)
Nasal congestion, fatigue and sore throat began yesterday. Will treat with Paxlovid and Bromfed DM for cough. Encouraged adequate hydration. Continue nasal rinses and breathing strips as needed. Continue Tylenol/Aleve as needed. Quarantine recommendations discussed.

## 2022-04-04 NOTE — Telephone Encounter (Signed)
Pt called in this morning staying that she tested positive for Covid and need some advice on what to do or take. She would like a call back at [phone]

## 2022-04-04 NOTE — Telephone Encounter (Signed)
Pt has been scheduled with Tomasita Morrow, NP this afternoon.

## 2022-04-04 NOTE — Progress Notes (Signed)
MyChart Video Visit    Virtual Visit via Video Note   This visit type was conducted due to national recommendations for restrictions regarding the COVID-19 Pandemic (e.g. social distancing) in an effort to limit this patient's exposure and mitigate transmission in our community. This patient is at least at moderate risk for complications without adequate follow up. This format is felt to be most appropriate for this patient at this time. Physical exam was limited by quality of the video and audio technology used for the visit. CMA was able to get the patient set up on a video visit.  Patient location: Home. Patient and provider in visit Provider location: Office  I discussed the limitations of evaluation and management by telemedicine and the availability of in person appointments. The patient expressed understanding and agreed to proceed.  Visit Date: 04/04/2022  Today's healthcare provider: Tomasita Morrow, NP     Subjective:    Patient ID: Christina Hammond, female    DOB: [birth year], 72 y.o.   MRN: [id number]  Chief Complaint  Patient presents with   Covid Positive    Tested yesterday and today and both tests were positive. Body aches, nasal congestion, cough, scratchy throat, head ache.    HPI Patient reports having a sore throat and nasal congestion that began yesterday morning. She reports worsening symptoms last night and today. She tested herself yesterday and today for COVID and both were positive.   Respiratory illness:  Cough- Yes  Congestion-    Sinus- Yes, nasal   Chest- No  Post nasal drip- Minimal  Sore throat- Yes  Shortness of breath- No  Fever- No  Fatigue/Myalgia- Yes Headache- Yes Nausea/Vomiting- No Taste disturbance- No  Smell disturbance- No  Covid vaccination- x 5  Flu vaccination- UTD  Medications- Tylenol, breathing strips, nasal rinses   Past Medical History:  Diagnosis Date   Cancer ([place])    basal cell,  [facility]    Restless legs  syndrome ([facility])    managed with minimal meds    Past Surgical History:  Procedure Laterality Date   ABDOMINAL HYSTERECTOMY  1979   APPENDECTOMY  1956   CESAREAN SECTION     CHOLECYSTECTOMY      Family History  Problem Relation Age of Onset   Diabetes Mother    Stroke Mother    Cancer Father    Heart disease Father    Cancer Maternal Aunt    Mental retardation Maternal Aunt    Alcohol abuse Maternal Uncle    Breast cancer Cousin     Social History   Socioeconomic History   Marital status: Married    Spouse name: Not on file   Number of children: Not on file   Years of education: Not on file   Highest education level: Not on file  Occupational History   Occupation: real estate agent  Tobacco Use   Smoking status: Never   Smokeless tobacco: Never  Substance and Sexual Activity   Alcohol use: Yes    Alcohol/week: 1.0 - 2.0 standard drink of alcohol    Types: 1 - 2 Glasses of wine per week    Comment: weekends, social    Drug use: No   Sexual activity: Not on file  Other Topics Concern   Not on file  Social History Narrative   ** Merged History Encounter **       Social Determinants of Health   Financial Resource Strain: Low Risk  (09/29/2020)   Overall Financial  Resource Strain (CARDIA)    Difficulty of Paying Living Expenses: Not hard at all  Food Insecurity: No Food Insecurity (09/29/2020)   Hunger Vital Sign    Worried About Running Out of Food in the Last Year: Never true    Ran Out of Food in the Last Year: Never true  Transportation Needs: No Transportation Needs (09/29/2020)   PRAPARE - Hydrologist (Medical): No    Lack of Transportation (Non-Medical): No  Physical Activity: Insufficiently Active (09/29/2020)   Exercise Vital Sign    Days of Exercise per Week: 3 days    Minutes of Exercise per Session: 30 min  Stress: No Stress Concern Present (09/29/2020)   [place]    Feeling of Stress : Not at all  Social Connections: Unknown (09/29/2020)   Social Connection and Isolation Panel [NHANES]    Frequency of Communication with Friends and Family: Not on file    Frequency of Social Gatherings with Friends and Family: Not on file    Attends Religious Services: Not on file    Active Member of Clubs or Organizations: Not on file    Attends Archivist Meetings: Not on file    Marital Status: Married  Intimate Partner Violence: Not At Risk (09/29/2020)   Humiliation, Afraid, Rape, and Kick questionnaire    Fear of Current or Ex-Partner: No    Emotionally Abused: No    Physically Abused: No    Sexually Abused: No    Outpatient Medications Prior to Visit  Medication Sig Dispense Refill   acetaminophen (TYLENOL) 325 MG tablet Take 325 mg by mouth at bedtime.     diphenhydrAMINE (BENADRYL) 25 mg capsule Take 25 mg by mouth at bedtime.     omeprazole (PRILOSEC) 20 MG capsule Take 20 mg by mouth daily.     ALPRAZolam (XANAX) 0.25 MG tablet Take 1 tablet (0.25 mg total) by mouth 2 (two) times daily as needed for anxiety. (Patient not taking: Reported on 04/04/2022) 60 tablet 0   mometasone (NASONEX) 50 MCG/ACT nasal spray Place 2 sprays into the nose daily. (Patient not taking: Reported on 04/04/2022) 17 g 12   amoxicillin-clavulanate (AUGMENTIN) 875-125 MG tablet Take 1 tablet by mouth 2 (two) times daily. 14 tablet 0   ondansetron (ZOFRAN) 4 MG tablet Take 1 tablet (4 mg total) by mouth every 8 (eight) hours as needed for nausea or vomiting. 20 tablet 0   No facility-administered medications prior to visit.    Allergies  Allergen Reactions   Lunesta [Eszopiclone] Other (See Comments)    Xerostomia and epistaxis    Diclofenac Hives   Ibuprofen Hives    ROS See HPI    Objective:    Physical Exam  Ht '5\' 7"'$  (1.702 m)   Wt 166 lb (75.3 kg) Comment: Pt rerported  BMI 26.00 kg/m  Wt Readings from Last 3 Encounters:  04/04/22 166  lb (75.3 kg)  02/21/22 168 lb 12.8 oz (76.6 kg)  01/22/22 164 lb (74.4 kg)   GENERAL: alert, oriented, appears well and in no acute distress   HEENT: atraumatic, conjunttiva clear, no obvious abnormalities on inspection of external nose and ears   NECK: normal movements of the head and neck   LUNGS: on inspection no signs of respiratory distress, breathing rate appears normal, no obvious gross SOB, gasping or wheezing   CV: no obvious cyanosis   MS: moves all visible extremities without  noticeable abnormality   PSYCH/NEURO: pleasant and cooperative, no obvious depression or anxiety, speech and thought processing grossly intact     Assessment & Plan:   Problem List Items Addressed This Visit       Other   COVID-19 - Primary    Nasal congestion, fatigue and sore throat began yesterday. Will treat with Paxlovid and Bromfed DM for cough. Encouraged adequate hydration. Continue nasal rinses and breathing strips as needed. Continue Tylenol/Aleve as needed. Quarantine recommendations discussed.       Relevant Medications   nirmatrelvir/ritonavir (PAXLOVID) 20 x 150 MG & 10 x '100MG'$  TABS   brompheniramine-pseudoephedrine-DM 30-2-10 MG/5ML syrup    I have discontinued Christina Hammond "Nancy"'s amoxicillin-clavulanate and ondansetron. I am also having her start on nirmatrelvir/ritonavir and brompheniramine-pseudoephedrine-DM. Additionally, I am having her maintain her mometasone, omeprazole, acetaminophen, diphenhydrAMINE, and ALPRAZolam.  Meds ordered this encounter  Medications   nirmatrelvir/ritonavir (PAXLOVID) 20 x 150 MG & 10 x '100MG'$  TABS    Sig: Take 3 tablets by mouth 2 (two) times daily for 5 days. (Take nirmatrelvir 150 mg two tablets twice daily for 5 days and ritonavir 100 mg one tablet twice daily for 5 days) Patient GFR is 86.7    Dispense:  30 tablet    Refill:  0    Order Specific Question:   Supervising Provider    Answer:   Caryl Bis, ERIC G [[id number]]    brompheniramine-pseudoephedrine-DM 30-2-10 MG/5ML syrup    Sig: Take 5-10 mLs by mouth every 6 (six) hours as needed.    Dispense:  120 mL    Refill:  0    Order Specific Question:   Supervising Provider    Answer:   Caryl Bis, ERIC G [[id number]]    I discussed the assessment and treatment plan with the patient. The patient was provided an opportunity to ask questions and all were answered. The patient agreed with the plan and demonstrated an understanding of the instructions.   The patient was advised to call back or seek an in-person evaluation if the symptoms worsen or if the condition fails to improve as anticipated.    Tomasita Morrow, NP [facility] [phone] (phone) [phone] (fax)  [place]

## 2022-04-08 ENCOUNTER — Other Ambulatory Visit: Payer: Self-pay

## 2022-04-08 ENCOUNTER — Emergency Department
Admission: EM | Admit: 2022-04-08 | Discharge: 2022-04-08 | Disposition: A | Discharge status: Home / Self Care | Payer: Medicare Other | Attending: Emergency Medicine | Admitting: Emergency Medicine

## 2022-04-08 ENCOUNTER — Encounter: Payer: Self-pay | Admitting: Emergency Medicine

## 2022-04-08 DIAGNOSIS — R040 Epistaxis: Secondary | ICD-10-CM | POA: Diagnosis not present

## 2022-04-08 DIAGNOSIS — J342 Deviated nasal septum: Secondary | ICD-10-CM | POA: Diagnosis not present

## 2022-04-08 MED ORDER — OXYMETAZOLINE HCL 0.05 % NA SOLN
2.0000 | Freq: Once | NASAL | Status: AC
  Administered 2022-04-08: 2 via NASAL
  Filled 2022-04-08: qty 30

## 2022-04-08 NOTE — ED Provider Notes (Signed)
[facility] Provider Note    Event Date/Time   First MD Initiated Contact with Patient 04/08/22 1319     (approximate)  History   Chief Complaint: Epistaxis  HPI  Christina Hammond is a 72 y.o. female with a past medical history of restless leg syndrome presents to the emergency department for epistaxis.  According to the patient over the past month or 2 she has been experiencing some intermittent nosebleeds.  Patient states this morning around 11:00 she started with a nosebleed out of both sides of her nostrils as well as clots.  Patient was concerned so she went to urgent care and they referred the patient to the emergency department due to ongoing bleed.  Here the patient arrives with a nasal clamp, bleeding is controlled.  Patient denies any anticoagulation or any medications.  Physical Exam   Triage Vital Signs: ED Triage Vitals  Enc Vitals Group     BP 04/08/22 1259 (!) 162/107     Pulse Rate 04/08/22 1259 (!) 110     Resp 04/08/22 1259 18     Temp 04/08/22 1259 98 F (36.7 C)     Temp Source 04/08/22 1259 Axillary     SpO2 04/08/22 1259 98 %     Weight 04/08/22 1331 165 lb 12.6 oz (75.2 kg)     Height 04/08/22 1331 '5\' 7"'$  (1.702 m)     Head Circumference --      Peak Flow --      Pain Score 04/08/22 1303 0     Pain Loc --      Pain Edu? --      Excl. in [place]? --     Most recent vital signs: Vitals:   04/08/22 1259  BP: (!) 162/107  Pulse: (!) 110  Resp: 18  Temp: 98 F (36.7 C)  SpO2: 98%    General: Awake, no distress.  CV:  Good peripheral perfusion.  Regular rate and rhythm  Resp:  Normal effort.  Equal breath sounds bilaterally.  Abd:  No distention.  Other:  Epistaxis controlled currently.  There is no blood seen within the right nostril there is dried blood on the left septum but no active bleed.  No blood in the oropharynx.   ED Results / Procedures / Treatments    MEDICATIONS ORDERED IN ED: Medications   oxymetazoline (AFRIN) 0.05 % nasal spray 2 spray (has no administration in time range)     IMPRESSION / MDM / ASSESSMENT AND PLAN / ED COURSE  I reviewed the triage vital signs and the nursing notes.  Patient's presentation is most consistent with acute illness / injury with system symptoms.  Patient presents emergency department for nosebleed.  States over the last month or so she has been experiencing intermittent nosebleeds but this was the worst when she has had.  Patient does follow-up with ENT and has a follow-up appointment next week.  Patient denies any blood thinners or anticoagulants.  As the bleeding is currently controlled we will use Afrin in both naris and monitor for 30 minutes or so to ensure that epistaxis remains controlled.  As long as the patient remains hemostatic anticipate likely discharge home with more supportive care such as Vaseline in the nostrils several times a day to keep the septum moist, using humidifiers at night, etc.  Patient remains hemostatic.  Patient will be discharged home.  FINAL CLINICAL IMPRESSION(S) / ED DIAGNOSES   Epistaxis    Note:  This  document was prepared using Systems analyst and may include unintentional dictation errors.   Harvest Dark, MD 04/08/22 1511

## 2022-04-08 NOTE — Discharge Instructions (Addendum)
As we discussed if you have any further bleeding please use 2 sprays of Afrin in each nostril and clamp the nose for 15 minutes.  If bleeding continues please return to the emergency department for evaluation.  Otherwise please use Vaseline in your nostril several times throughout the day and just before night to keep the nose moist.  Please also use a humidifier next to your bed at night.  Please follow-up with ENT as scheduled or earlier if possible.

## 2022-04-08 NOTE — ED Notes (Signed)
Melissa Montane reassess   Harvest Dark, MD 04/08/22 1347

## 2022-04-08 NOTE — ED Triage Notes (Signed)
Pt to ED via POV from home. Pt reports after a nasal rinse her nose started bleeding and has had blood clots. Pt has been present x1 hr. Pt reports has started having nosebleeds more frequent but have stopped on their own. Pt denies blood thinners.

## 2022-04-08 NOTE — ED Provider Triage Note (Signed)
Emergency Medicine Provider Triage Evaluation Note  Christina Hammond , a 72 y.o. female  was evaluated in triage.  Pt complains of epistaxis after doing a nasal rinse. Reports there are clots on both sides. [facility] to [facility] and they sent her here. No blood thinners. Started one hour ago.  Review of Systems  Positive: Nose bleed Negative: dizzy  Physical Exam  There were no vitals taken for this visit. Gen:   Awake, no distress, holding paper towel to nose   Resp:  Normal effort  MSK:   Moves extremities without difficulty  Other:    Medical Decision Making  Medically screening exam initiated at 12:58 PM.  Appropriate orders placed.  Andreas Ohm was informed that the remainder of the evaluation will be completed by another provider, this initial triage assessment does not replace that evaluation, and the importance of remaining in the ED until their evaluation is complete.     Marquette Old, PA-C 04/08/22 1259

## 2022-04-12 DIAGNOSIS — R040 Epistaxis: Secondary | ICD-10-CM | POA: Diagnosis not present

## 2022-04-14 ENCOUNTER — Encounter: Payer: Self-pay | Admitting: Internal Medicine

## 2022-04-15 ENCOUNTER — Other Ambulatory Visit: Payer: Self-pay | Admitting: Internal Medicine

## 2022-04-15 DIAGNOSIS — R040 Epistaxis: Secondary | ICD-10-CM

## 2022-04-16 DIAGNOSIS — R040 Epistaxis: Secondary | ICD-10-CM | POA: Diagnosis not present

## 2022-04-17 ENCOUNTER — Other Ambulatory Visit (INDEPENDENT_AMBULATORY_CARE_PROVIDER_SITE_OTHER): Payer: Medicare Other

## 2022-04-17 DIAGNOSIS — R040 Epistaxis: Secondary | ICD-10-CM

## 2022-04-17 LAB — CBC WITH DIFFERENTIAL/PLATELET
Basophils Absolute: 0 10*3/uL (ref 0.0–0.1)
Basophils Relative: 1 % (ref 0.0–3.0)
Eosinophils Absolute: 0.1 10*3/uL (ref 0.0–0.7)
Eosinophils Relative: 1.9 % (ref 0.0–5.0)
HCT: 37.5 % (ref 36.0–46.0)
Hemoglobin: 12.8 g/dL (ref 12.0–15.0)
Lymphocytes Relative: 35.3 % (ref 12.0–46.0)
Lymphs Abs: 1.7 10*3/uL (ref 0.7–4.0)
MCHC: 34 g/dL (ref 30.0–36.0)
MCV: 87.4 fl (ref 78.0–100.0)
Monocytes Absolute: 0.4 10*3/uL (ref 0.1–1.0)
Monocytes Relative: 8.1 % (ref 3.0–12.0)
Neutro Abs: 2.6 10*3/uL (ref 1.4–7.7)
Neutrophils Relative %: 53.7 % (ref 43.0–77.0)
Platelets: 361 10*3/uL (ref 150.0–400.0)
RBC: 4.3 Mil/uL (ref 3.87–5.11)
RDW: 13.6 % (ref 11.5–15.5)
WBC: 4.8 10*3/uL (ref 4.0–10.5)

## 2022-04-17 LAB — PROTIME-INR
INR: 1 ratio (ref 0.8–1.0)
Prothrombin Time: 11.4 s (ref 9.6–13.1)

## 2022-04-17 LAB — HEPATIC FUNCTION PANEL
ALT: 23 U/L (ref 0–35)
AST: 15 U/L (ref 0–37)
Albumin: 4.1 g/dL (ref 3.5–5.2)
Alkaline Phosphatase: 63 U/L (ref 39–117)
Bilirubin, Direct: 0.1 mg/dL (ref 0.0–0.3)
Total Bilirubin: 0.4 mg/dL (ref 0.2–1.2)
Total Protein: 6.6 g/dL (ref 6.0–8.3)

## 2022-04-17 LAB — APTT: aPTT: 33.2 s (ref 25.4–36.8)

## 2022-04-23 LAB — VITAMIN K1, SERUM: Vitamin K: 1006 pg/mL (ref 130–1500)

## 2022-04-30 DIAGNOSIS — M9903 Segmental and somatic dysfunction of lumbar region: Secondary | ICD-10-CM | POA: Diagnosis not present

## 2022-04-30 DIAGNOSIS — M9901 Segmental and somatic dysfunction of cervical region: Secondary | ICD-10-CM | POA: Diagnosis not present

## 2022-04-30 DIAGNOSIS — M62830 Muscle spasm of back: Secondary | ICD-10-CM | POA: Diagnosis not present

## 2022-04-30 DIAGNOSIS — M5416 Radiculopathy, lumbar region: Secondary | ICD-10-CM | POA: Diagnosis not present

## 2022-05-17 DIAGNOSIS — H02839 Dermatochalasis of unspecified eye, unspecified eyelid: Secondary | ICD-10-CM | POA: Diagnosis not present

## 2022-05-17 DIAGNOSIS — H02409 Unspecified ptosis of unspecified eyelid: Secondary | ICD-10-CM | POA: Insufficient documentation

## 2022-05-17 DIAGNOSIS — H5340 Unspecified visual field defects: Secondary | ICD-10-CM | POA: Diagnosis not present

## 2022-05-28 DIAGNOSIS — M62830 Muscle spasm of back: Secondary | ICD-10-CM | POA: Diagnosis not present

## 2022-05-28 DIAGNOSIS — M9903 Segmental and somatic dysfunction of lumbar region: Secondary | ICD-10-CM | POA: Diagnosis not present

## 2022-05-28 DIAGNOSIS — M9901 Segmental and somatic dysfunction of cervical region: Secondary | ICD-10-CM | POA: Diagnosis not present

## 2022-05-28 DIAGNOSIS — M5416 Radiculopathy, lumbar region: Secondary | ICD-10-CM | POA: Diagnosis not present

## 2022-06-13 ENCOUNTER — Encounter: Payer: Self-pay | Admitting: *Deleted

## 2022-06-13 ENCOUNTER — Ambulatory Visit (INDEPENDENT_AMBULATORY_CARE_PROVIDER_SITE_OTHER): Payer: Medicare Other | Admitting: *Deleted

## 2022-06-13 VITALS — BP 129/79 | Ht 67.0 in | Wt 168.8 lb

## 2022-06-13 DIAGNOSIS — Z0000 Encounter for general adult medical examination without abnormal findings: Secondary | ICD-10-CM | POA: Diagnosis not present

## 2022-06-13 NOTE — Progress Notes (Signed)
I connected with  Christina Hammond on 06/13/22 by a audio enabled telemedicine application and verified that I am speaking with the correct person using two identifiers.  Patient Location: Home  Provider Location: Office/Clinic  I discussed the limitations of evaluation and management by telemedicine. The patient expressed understanding and agreed to proceed.   .  Subjective:   Christina Hammond is a 72 y.o. female who presents for Medicare Annual (Subsequent) preventive examination.         Objective:    Today's Vitals   06/13/22 1401  BP: 129/79  Weight: 168 lb 12.8 oz (76.6 kg)  Height: 5\' 7"  (1.702 m)   Body mass index is 26.44 kg/m.     06/13/2022    2:08 PM 04/08/2022    1:04 PM 09/29/2020    2:58 PM 09/22/2019    9:56 AM 09/21/2018    9:56 AM 09/16/2017   10:12 AM  Advanced Directives  Does Patient Have a Medical Advance Directive? Yes No Yes Yes Yes Yes  Type of Paramedic of [place];Living will  [place];Living will Living will;Healthcare Power of [place];Living will [place]  Does patient want to make changes to medical advance directive? No - Patient declined  No - Patient declined No - Patient declined No - Patient declined No - Patient declined  Copy of [place] in Chart? No - copy requested  No - copy requested No - copy requested No - copy requested   Would patient like information on creating a medical advance directive?  No - Patient declined        Current Medications (verified) Outpatient Encounter Medications as of 06/13/2022  Medication Sig   acetaminophen (TYLENOL) 325 MG tablet Take 325 mg by mouth at bedtime.   diphenhydrAMINE (BENADRYL) 25 mg capsule Take 25 mg by mouth at bedtime as needed for sleep, allergies or itching.   omeprazole (PRILOSEC) 20 MG capsule Take 20 mg by mouth daily.   [DISCONTINUED] ALPRAZolam (XANAX)  0.25 MG tablet Take 1 tablet (0.25 mg total) by mouth 2 (two) times daily as needed for anxiety. (Patient not taking: Reported on 04/04/2022)   [DISCONTINUED] brompheniramine-pseudoephedrine-DM 30-2-10 MG/5ML syrup Take 5-10 mLs by mouth every 6 (six) hours as needed.   No facility-administered encounter medications on file as of 06/13/2022.    Allergies (verified) Lunesta [eszopiclone], Diclofenac, and Ibuprofen   History: Past Medical History:  Diagnosis Date   Cancer ([place])    basal cell,  [facility]    Restless legs syndrome (RLS)    managed with minimal meds   Past Surgical History:  Procedure Laterality Date   ABDOMINAL HYSTERECTOMY  1979   APPENDECTOMY  1956   CESAREAN SECTION     CHOLECYSTECTOMY     Family History  Problem Relation Age of Onset   Diabetes Mother    Stroke Mother    Cancer Father    Heart disease Father    Cancer Maternal Aunt    Mental retardation Maternal Aunt    Alcohol abuse Maternal Uncle    Breast cancer Cousin    Social History   Socioeconomic History   Marital status: Married    Spouse name: Not on file   Number of children: Not on file   Years of education: Not on file   Highest education level: Not on file  Occupational History   Occupation: real estate agent  Tobacco Use  Smoking status: Never   Smokeless tobacco: Never  Substance and Sexual Activity   Alcohol use: Yes    Alcohol/week: 1.0 - 2.0 standard drink of alcohol    Types: 1 - 2 Glasses of wine per week    Comment: weekends, social    Drug use: No   Sexual activity: Not on file  Other Topics Concern   Not on file  Social History Narrative   ** Merged History Encounter **       Social Determinants of Health   Financial Resource Strain: Low Risk  (06/12/2022)   Overall Financial Resource Strain (CARDIA)    Difficulty of Paying Living Expenses: Not hard at all  Food Insecurity: No Food Insecurity (06/12/2022)   Hunger Vital Sign    Worried About Running Out of Food in  the Last Year: Never true    Ran Out of Food in the Last Year: Never true  Transportation Needs: No Transportation Needs (06/12/2022)   PRAPARE - Hydrologist (Medical): No    Lack of Transportation (Non-Medical): No  Physical Activity: Sufficiently Active (06/12/2022)   Exercise Vital Sign    Days of Exercise per Week: 4 days    Minutes of Exercise per Session: 40 min  Stress: No Stress Concern Present (06/12/2022)   [place]    Feeling of Stress : Not at all  Social Connections: Unknown (06/12/2022)   Social Connection and Isolation Panel [NHANES]    Frequency of Communication with Friends and Family: More than three times a week    Frequency of Social Gatherings with Friends and Family: Twice a week    Attends Religious Services: Not on Advertising copywriter or Organizations: Yes    Attends Music therapist: More than 4 times per year    Marital Status: Married    Tobacco Counseling Counseling given: Not Answered   Clinical Intake:  Pre-visit preparation completed: Yes  Pain : No/denies pain     BMI - recorded: 26.43 Nutritional Status: BMI 25 -29 Overweight Diabetes: No  How often do you need to have someone help you when you read instructions, pamphlets, or other written materials from your doctor or pharmacy?: 1 - Never  Diabetic? No  Interpreter Needed?: No      Activities of Daily Living    06/12/2022    5:12 PM 06/09/2022   12:57 PM  In your present state of health, do you have any difficulty performing the following activities:  Hearing? 0 0  Vision? 0 0  Difficulty concentrating or making decisions? 0 0  Walking or climbing stairs? 0 0  Dressing or bathing? 0 0  Doing errands, shopping? 0 0  Preparing Food and eating ? N N  Using the Toilet? N N  In the past six months, have you accidently leaked urine? N N  Do you have problems with  loss of bowel control? N N  Managing your Medications? N N  Managing your Finances? N N    Patient Care Team: Crecencio Mc, MD as PCP - General (Internal Medicine)  Indicate any recent Medical Services you may have received from other than [facility] providers in the past year (date may be approximate).     Assessment:   This is a routine wellness examination for Christina Hammond.  Hearing/Vision screen No results found.  Dietary issues and exercise activities discussed:     Goals  Addressed   None    Depression Screen    06/13/2022    2:07 PM 04/04/2022    3:02 PM 02/21/2022    9:25 AM 01/22/2022    4:44 PM 09/29/2020    2:54 PM 09/22/2019    9:38 AM 09/21/2018    9:41 AM  PHQ 2/9 Scores  PHQ - 2 Score 0 0 0 0 0 0 0    Fall Risk    06/13/2022    2:07 PM 06/12/2022    5:12 PM 06/09/2022   12:57 PM 04/04/2022    3:01 PM 02/21/2022    9:25 AM  Fall Risk   Falls in the past year? 0 0 0 0 0  Number falls in past yr: 0 0 0 0   Injury with Fall? 0   0   Risk for fall due to : No Fall Risks   No Fall Risks No Fall Risks  Follow up    Falls evaluation completed Falls evaluation completed    FALL RISK PREVENTION PERTAINING TO THE HOME:  Any stairs in or around the home? Yes  If so, are there any without handrails? Yes  Home free of loose throw rugs in walkways, pet beds, electrical cords, etc? Yes  Adequate lighting in your home to reduce risk of falls? Yes   ASSISTIVE DEVICES UTILIZED TO PREVENT FALLS:  Life alert? No  Use of a cane, walker or w/c? No  Grab bars in the bathroom? No  Shower chair or bench in shower? No  Elevated toilet seat or a handicapped toilet? Yes     Cognitive Function:    09/22/2019    9:57 AM 09/16/2017   10:16 AM  MMSE - Mini Mental State Exam  Not completed: Unable to complete   Orientation to time  5  Orientation to Place  5  Registration  3  Attention/ Calculation  5  Recall  3  Language- name 2 objects  2  Language- repeat  1  Language-  follow 3 step command  3  Language- read & follow direction  1  Write a sentence  1  Copy design  1  Total score  30        06/13/2022    2:09 PM 09/21/2018    9:49 AM  6CIT Screen  What Year? 0 points 0 points  What month? 0 points 0 points  What time? 0 points 0 points  Count back from 20 0 points 0 points  Months in reverse 0 points 0 points  Repeat phrase 2 points 0 points  Total Score 2 points 0 points    Immunizations Immunization History  Administered Date(s) Administered   Fluad Quad(high Dose 65+) 11/10/2018   Influenza Split 01/10/2012, 12/16/2013   Influenza, High Dose Seasonal PF 01/10/2017, 12/26/2020, 01/08/2022   Influenza, Seasonal, Injecte, Preservative Fre 11/25/2014   Influenza,inj,Quad PF,6+ Mos 12/10/2012   Influenza-Unspecified 01/18/2016, 01/12/2018   PFIZER Comirnaty(Gray Top)Covid-19 Tri-Sucrose Vaccine 01/08/2022   PFIZER(Purple Top)SARS-COV-2 Vaccination 04/06/2019, 04/26/2019, 11/19/2019, 07/13/2020   Pneumococcal Conjugate-13 07/07/2013   Pneumococcal Polysaccharide-23 01/10/2017   Tdap 10/11/2011   Zoster Recombinat (Shingrix) 01/08/2022   Zoster, Live 01/26/2014    TDAP status: Due, Education has been provided regarding the importance of this vaccine. Advised may receive this vaccine at local pharmacy or Health Dept. Aware to provide a copy of the vaccination record if obtained from local pharmacy or Health Dept. Verbalized acceptance and understanding.  Flu Vaccine status: Up to date  Pneumococcal vaccine status: Up to date  Covid-19 vaccine status: Information provided on how to obtain vaccines.   Qualifies for Shingles Vaccine? No   Zostavax completed No   Shingrix Completed?: Yes  Screening Tests Health Maintenance  Topic Date Due   DTaP/Tdap/Td (2 - Td or Tdap) 10/10/2021   COVID-19 Vaccine (6 - 2023-24 season) 03/05/2022   Zoster Vaccines- Shingrix (2 of 2) 03/05/2022   MAMMOGRAM  08/01/2022   Medicare Annual Wellness (AWV)   06/13/2023   Fecal DNA (Cologuard)  11/17/2023   Pneumonia Vaccine 44+ Years old  Completed   INFLUENZA VACCINE  Completed   DEXA SCAN  Completed   Hepatitis C Screening  Completed   HPV VACCINES  Aged Out    Health Maintenance  Health Maintenance Due  Topic Date Due   DTaP/Tdap/Td (2 - Td or Tdap) 10/10/2021   COVID-19 Vaccine (6 - 2023-24 season) 03/05/2022   Zoster Vaccines- Shingrix (2 of 2) 03/05/2022    Colorectal cancer screening: Type of screening: Cologuard. Completed 11/16/2020. Repeat every 3 years  Mammogram status: Completed 07/31/2021. Repeat every year  Bone Density status: Completed 01/04/2020. Results reflect: Bone density results: OSTEOPENIA. Repeat every 3 years.  Lung Cancer Screening: (Low Dose CT Chest recommended if Age 57-80 years, 30 pack-year currently smoking OR have quit w/in 15years.) does not qualify.    Additional Screening:  Hepatitis C Screening: does qualify; Completed 09/01/2014  Vision Screening: Recommended annual ophthalmology exams for early detection of glaucoma and other disorders of the eye. Is the patient up to date with their annual eye exam?  Yes  Who is the provider or what is the name of the office in which the patient attends annual eye exams? Has an upcoming appt at [facility] If pt is not established with a provider, would they like to be referred to a provider to establish care? No .   Dental Screening: Recommended annual dental exams for proper oral hygiene  Community Resource Referral / Chronic Care Management: CRR required this visit?  No   CCM required this visit?  No      Plan:     I have personally reviewed and noted the following in the patient's chart:   Medical and social history Use of alcohol, tobacco or illicit drugs  Current medications and supplements including opioid prescriptions. Patient is not currently taking opioid prescriptions. Functional ability and status Nutritional status Physical  activity Advanced directives List of other physicians Hospitalizations, surgeries, and ER visits in previous 12 months Vitals Screenings to include cognitive, depression, and falls Referrals and appointments  In addition, I have reviewed and discussed with patient certain preventive protocols, quality metrics, and best practice recommendations. A written personalized care plan for preventive services as well as general preventive health recommendations were provided to patient.     Cannon Kettle, [place]   06/13/2022   Nurse Notes: Total time spent on telephone visit today was 10 minutes.

## 2022-06-13 NOTE — Patient Instructions (Signed)
  Christina Hammond , Thank you for taking time to come for your Medicare Wellness Visit. I appreciate your ongoing commitment to your health goals. Please review the following plan we discussed and let me know if I can assist you in the future.   These are the goals we discussed:  Goals      Maintain Healthy Lifestyle     Stay active Stay hydrated Healthy diet        This is a list of the screening recommended for you and due dates:  Health Maintenance  Topic Date Due   DTaP/Tdap/Td vaccine (2 - Td or Tdap) 10/10/2021   COVID-19 Vaccine (6 - 2023-24 season) 03/05/2022   Zoster (Shingles) Vaccine (2 of 2) 03/05/2022   Mammogram  08/01/2022   Medicare Annual Wellness Visit  06/13/2023   Cologuard (Stool DNA test)  11/17/2023   Pneumonia Vaccine  Completed   Flu Shot  Completed   DEXA scan (bone density measurement)  Completed   Hepatitis C Screening: USPSTF Recommendation to screen - Ages 24-79 yo.  Completed   HPV Vaccine  Aged Out

## 2022-06-25 DIAGNOSIS — M9903 Segmental and somatic dysfunction of lumbar region: Secondary | ICD-10-CM | POA: Diagnosis not present

## 2022-06-25 DIAGNOSIS — M9901 Segmental and somatic dysfunction of cervical region: Secondary | ICD-10-CM | POA: Diagnosis not present

## 2022-06-25 DIAGNOSIS — M5416 Radiculopathy, lumbar region: Secondary | ICD-10-CM | POA: Diagnosis not present

## 2022-06-25 DIAGNOSIS — M62830 Muscle spasm of back: Secondary | ICD-10-CM | POA: Diagnosis not present

## 2022-07-04 ENCOUNTER — Emergency Department
Admission: EM | Admit: 2022-07-04 | Discharge: 2022-07-04 | Disposition: A | Discharge status: Home / Self Care | Payer: Medicare Other | Attending: Emergency Medicine | Admitting: Emergency Medicine

## 2022-07-04 ENCOUNTER — Emergency Department: Payer: Medicare Other

## 2022-07-04 ENCOUNTER — Other Ambulatory Visit: Payer: Self-pay

## 2022-07-04 ENCOUNTER — Encounter: Payer: Self-pay | Admitting: Emergency Medicine

## 2022-07-04 DIAGNOSIS — Z85828 Personal history of other malignant neoplasm of skin: Secondary | ICD-10-CM | POA: Insufficient documentation

## 2022-07-04 DIAGNOSIS — J40 Bronchitis, not specified as acute or chronic: Secondary | ICD-10-CM

## 2022-07-04 DIAGNOSIS — R0602 Shortness of breath: Secondary | ICD-10-CM | POA: Diagnosis not present

## 2022-07-04 DIAGNOSIS — Z1152 Encounter for screening for COVID-19: Secondary | ICD-10-CM | POA: Insufficient documentation

## 2022-07-04 DIAGNOSIS — R079 Chest pain, unspecified: Secondary | ICD-10-CM

## 2022-07-04 DIAGNOSIS — R0789 Other chest pain: Secondary | ICD-10-CM | POA: Diagnosis not present

## 2022-07-04 DIAGNOSIS — R911 Solitary pulmonary nodule: Secondary | ICD-10-CM | POA: Diagnosis not present

## 2022-07-04 DIAGNOSIS — H25813 Combined forms of age-related cataract, bilateral: Secondary | ICD-10-CM | POA: Diagnosis not present

## 2022-07-04 LAB — CBC WITH DIFFERENTIAL/PLATELET
Abs Immature Granulocytes: 0.01 10*3/uL (ref 0.00–0.07)
Basophils Absolute: 0 10*3/uL (ref 0.0–0.1)
Basophils Relative: 1 %
Eosinophils Absolute: 0.1 10*3/uL (ref 0.0–0.5)
Eosinophils Relative: 3 %
HCT: 43.8 % (ref 36.0–46.0)
Hemoglobin: 13.6 g/dL (ref 12.0–15.0)
Immature Granulocytes: 0 %
Lymphocytes Relative: 50 %
Lymphs Abs: 2.6 10*3/uL (ref 0.7–4.0)
MCH: 27.9 pg (ref 26.0–34.0)
MCHC: 31.1 g/dL (ref 30.0–36.0)
MCV: 89.9 fL (ref 80.0–100.0)
Monocytes Absolute: 0.4 10*3/uL (ref 0.1–1.0)
Monocytes Relative: 7 %
Neutro Abs: 2 10*3/uL (ref 1.7–7.7)
Neutrophils Relative %: 39 %
Platelets: 285 10*3/uL (ref 150–400)
RBC: 4.87 MIL/uL (ref 3.87–5.11)
RDW: 12.9 % (ref 11.5–15.5)
WBC: 5.1 10*3/uL (ref 4.0–10.5)
nRBC: 0 % (ref 0.0–0.2)

## 2022-07-04 LAB — COMPREHENSIVE METABOLIC PANEL
ALT: 15 U/L (ref 0–44)
AST: 18 U/L (ref 15–41)
Albumin: 4 g/dL (ref 3.5–5.0)
Alkaline Phosphatase: 50 U/L (ref 38–126)
Anion gap: 8 (ref 5–15)
BUN: 19 mg/dL (ref 8–23)
CO2: 27 mmol/L (ref 22–32)
Calcium: 8.7 mg/dL — ABNORMAL LOW (ref 8.9–10.3)
Chloride: 103 mmol/L (ref 98–111)
Creatinine, Ser: 0.74 mg/dL (ref 0.44–1.00)
GFR, Estimated: >60 mL/min (ref >60)
Glucose, Bld: 107 mg/dL — ABNORMAL HIGH (ref 70–99)
Potassium: 3.4 mmol/L — ABNORMAL LOW (ref 3.5–5.1)
Sodium: 138 mmol/L (ref 135–145)
Total Bilirubin: 0.5 mg/dL (ref 0.3–1.2)
Total Protein: 6.9 g/dL (ref 6.5–8.1)

## 2022-07-04 LAB — TROPONIN I (HIGH SENSITIVITY)
Troponin I (High Sensitivity): <2 ng/L (ref <18)
Troponin I (High Sensitivity): 3 ng/L (ref <18)

## 2022-07-04 LAB — LIPASE, BLOOD: Lipase: 39 U/L (ref 11–51)

## 2022-07-04 LAB — SARS CORONAVIRUS 2 BY RT PCR: SARS Coronavirus 2 by RT PCR: NEGATIVE

## 2022-07-04 MED ORDER — SODIUM CHLORIDE 0.9 % IV BOLUS
500.0000 mL | Freq: Once | INTRAVENOUS | Status: AC
  Administered 2022-07-04: 500 mL via INTRAVENOUS

## 2022-07-04 MED ORDER — IOHEXOL 350 MG/ML SOLN
75.0000 mL | Freq: Once | INTRAVENOUS | Status: AC | PRN
  Administered 2022-07-04: 75 mL via INTRAVENOUS

## 2022-07-04 MED ORDER — ALBUTEROL SULFATE HFA 108 (90 BASE) MCG/ACT IN AERS: 2.0000 | INHALATION_SPRAY | RESPIRATORY_TRACT | 0 refills | Status: DC | PRN

## 2022-07-04 NOTE — ED Notes (Signed)
Patient transported to CT 

## 2022-07-04 NOTE — ED Provider Notes (Signed)
8:44 AM Assumed care for off going team.   Blood pressure 134/76, pulse 75, temperature 97.7 F (36.5 C), temperature source Oral, resp. rate 12, height  (1.702 m), weight 77.8 kg, SpO2 98 %.  See their HPI for full report but in brief pending repeat trop.  Repeat trop is negative-reevaluated patient denies any chest pain.  Understands her incidental findings on CT scan that were originally discussed with Dr. Dolores Frame. Denies any other concerns.         Concha Se, MD 07/04/22 [phone]

## 2022-07-04 NOTE — ED Triage Notes (Signed)
Pt presents ambulatory to triage via POV with complaints of mid-sternal chest pressure that started this AM. Pt notes having some sinus pressure and fullness that led to some SOB and CP. At this time the pain has resolved but is feeling "ill" and wants to be checked out. A&Ox4 at this time. Denies fevers, nasal congestion, cough, N/V/D.

## 2022-07-04 NOTE — ED Provider Notes (Signed)
[facility] Provider Note    Event Date/Time   First MD Initiated Contact with Patient 07/04/22 [phone]     (approximate)   History   Chest Pain   HPI  Christina Hammond is a 72 y.o. female who presents to the ED from home with a chief complaint of chest pain.  Patient was awakened with midsternal chest pressure, nonradiating, not associated with diaphoresis, palpitations, nausea/vomiting or dizziness.  States she did have some tingling sensation in both shoulders and felt short of breath when she walked around.  History of COVID-19 infection 3 months ago.  Took 1 baby aspirin at home prior to arrival.  Currently all symptoms have resolved.  Does not take prescription medicines daily.  No history of hypertension.  Denies recent fever/chills, headache, vision changes, neck pain, cough, abdominal pain, dysuria or diarrhea.  Denies recent travel, trauma or hormone use.     Past Medical History   Past Medical History:  Diagnosis Date   Cancer    basal cell,  [facility]    Restless legs syndrome (RLS)    managed with minimal meds     Active Problem List   Patient Active Problem List   Diagnosis Date Noted   COVID-19 04/04/2022   E. 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   OA (osteoarthritis) of hip 07/10/2013   Encounter for preventive health examination 07/10/2013   Seasonal allergies 07/28/2012   Dermatitis 07/28/2012   Special screening for malignant neoplasms, colon 02/25/2012   Postmenopausal atrophic vaginitis 02/25/2012   S/P hysterectomy 10/13/2011   Hyperlipidemia 10/13/2011   Basal cell carcinoma of face    Restless legs syndrome (RLS)      Past Surgical History   Past Surgical  History:  Procedure Laterality Date   ABDOMINAL HYSTERECTOMY  1979   APPENDECTOMY  1956   CESAREAN SECTION     CHOLECYSTECTOMY       Home Medications   Prior to Admission medications   Medication Sig Start Date End Date Taking? Authorizing Provider  albuterol (VENTOLIN HFA) 108 (90 Base) MCG/ACT inhaler Inhale 2 puffs into the lungs every 4 (four) hours as needed for wheezing or shortness of breath. 07/04/22  Yes Irean Hong, MD  acetaminophen (TYLENOL) 325 MG tablet Take 325 mg by mouth at bedtime.    [provider]  diphenhydrAMINE (BENADRYL) 25 mg capsule Take 25 mg by mouth at bedtime as needed for sleep, allergies or itching.    [provider]  omeprazole (PRILOSEC) 20 MG capsule Take 20 mg by mouth daily.    [provider]     Allergies  Lunesta [eszopiclone], Diclofenac, and Ibuprofen   Family History   Family History  Problem Relation Age of Onset   Diabetes Mother    Stroke Mother    Cancer Father    Heart disease Father    Cancer Maternal Aunt    Mental retardation Maternal Aunt    Alcohol abuse Maternal Uncle    Breast cancer Cousin      Physical Exam  Triage Vital Signs: ED Triage Vitals  Enc Vitals Group     BP --      Pulse --      Resp --  Temp --      Temp src --      SpO2 --      Weight 07/04/22 0444 171 lb 8.3 oz (77.8 kg)     Height 07/04/22 0444 5\' 7"  (1.702 m)     Head Circumference --      Peak Flow --      Pain Score 07/04/22 0446 0     Pain Loc --      Pain Edu? --      Excl. in GC? --     Updated Vital Signs: BP 134/76   Pulse 75   Temp 97.7 F (36.5 C) (Oral)   Resp 12   Ht 5\' 7"  (1.702 m)   Wt 77.8 kg   SpO2 98%   BMI 26.86 kg/m    General: Awake, no distress.  CV:  RRR.  Good peripheral perfusion bilateral radial pulses.  Resp:  Normal effort.  CTAB. Abd:  Nontender.  No distention.  Other:  No carotid bruits.  No pedal edema.  Bilateral calves are supple and nontender.   ED  Results / Procedures / Treatments  Labs (all labs ordered are listed, but only abnormal results are displayed) Labs Reviewed  COMPREHENSIVE METABOLIC PANEL - Abnormal; Notable for the following components:      Result Value   Potassium 3.4 (*)    Glucose, Bld 107 (*)    Calcium 8.7 (*)    All other components within normal limits  SARS CORONAVIRUS 2 BY RT PCR  CBC WITH DIFFERENTIAL/PLATELET  LIPASE, BLOOD  TROPONIN I (HIGH SENSITIVITY)  TROPONIN I (HIGH SENSITIVITY)     EKG  ED ECG REPORT I, Jaylee Freeze J, the attending physician, personally viewed and interpreted this ECG.   Date: 07/04/2022  EKG Time: 0450  Rate: 76  Rhythm: normal sinus rhythm  Axis: Normal  Intervals:none  ST&T Change: Nonspecific    RADIOLOGY I have independently visualized and interpreted patient's chest x-ray as well as noted the radiology interpretation:  Chest x-ray: No acute cardiopulmonary process  CTA chest: No PE, bronchitis, pulmonary nodule  Official radiology report(s): CT Angio Chest PE W/Cm &/Or Wo Cm  Result Date: 07/04/2022 CLINICAL DATA:  Pulmonary embolism suspected. Midsternal chest pressure onset this morning. Shortness of breath and chest pain. EXAM: CT ANGIOGRAPHY CHEST WITH CONTRAST TECHNIQUE: Multidetector CT imaging of the chest was performed using the standard protocol during bolus administration of intravenous contrast. Multiplanar CT image reconstructions and MIPs were obtained to evaluate the vascular anatomy. RADIATION DOSE REDUCTION: This exam was performed according to the departmental dose-optimization program which includes automated exposure control, adjustment of the mA and/or kV according to patient size and/or use of iterative reconstruction technique. CONTRAST:  75mL OMNIPAQUE IOHEXOL 350 MG/ML SOLN COMPARISON:  Portable chest earlier today is the only prior chest study. FINDINGS: Cardiovascular: There is mild cardiomegaly, mild left ventricular hypertrophy. No  pericardial effusion. There are no visible coronary calcifications. There is mild scattered calcification in the aorta and great vessels without aneurysm, stenosis or dissection. The pulmonary arteries are normal caliber, with no evidence of arterial emboli. The pulmonary veins are normal caliber. Mediastinum/Nodes: The esophagus unremarkable aside from a moderately thickened EG junction. Endoscopic follow-up recommended. No thyroid nodule, intrathoracic or axillary adenopathy are seen. Small hiatal hernia. The thoracic trachea is clear. Lungs/Pleura: There is diffuse bronchial thickening. There is mild symmetric biapical pleural-parenchymal scarring change. Mild chronic elevation right hemidiaphragm. In the lower lobes there is mosaic attenuation which is usually  due to small airways disease with air trapping and microatelectasis intermixed. Underlying pneumonitis is possible but less likely and there is no confluent or dense pulmonary consolidation. There is no evidence of edema, pleural effusions or significant pulmonary nodules. There is a solitary 3 mm right middle lobe nodule anteriorly on 5:66. Upper Abdomen: No acute abnormality. Musculoskeletal: No chest wall abnormality. No acute or significant osseous findings. Review of the MIP images confirms the above findings. IMPRESSION: 1. No evidence of arterial dilatation or embolus. 2. Cardiomegaly with mild left ventricular hypertrophy. 3. Aortic atherosclerosis.  No visible coronary plaques. 4. Diffuse bronchial thickening consistent with bronchitis. 5. Mosaic attenuation in the lower lobes which is usually due to small airways disease with air trapping and microatelectasis. Underlying pneumonitis is possible but less likely. 6. 3 mm right middle lobe nodule. Per Fleischner Society Guidelines, no routine follow-up imaging is recommended. These guidelines do not apply to immunocompromised patients and patients with cancer. Follow up in patients with significant  comorbidities as clinically warranted. For lung cancer screening, adhere to Lung-RADS guidelines. Reference: Radiology. 2017; 284(1):228-43. 7. Small hiatal hernia with moderately thickened EG junction. Endoscopic follow-up recommended. Aortic Atherosclerosis (ICD10-I70.0). Electronically Signed   By: Almira Bar M.D.   On: 07/04/2022 06:09   DG Chest Portable 1 View  Result Date: 07/04/2022 CLINICAL DATA:  72 year old female with history of chest pain. EXAM: PORTABLE CHEST 1 VIEW COMPARISON:  No priors. FINDINGS: Lung volumes are normal. No consolidative airspace disease. No pleural effusions. No pneumothorax. No pulmonary nodule or mass noted. Pulmonary vasculature and the cardiomediastinal silhouette are within normal limits. Atherosclerosis in the thoracic aorta. IMPRESSION: 1.  No radiographic evidence of acute cardiopulmonary disease. 2. Aortic atherosclerosis. Electronically Signed   By: Trudie Reed M.D.   On: 07/04/2022 05:19     PROCEDURES:  Critical Care performed: No  .1-3 Lead EKG Interpretation  Performed by: Irean Hong, MD Authorized by: Irean Hong, MD     Interpretation: normal     ECG rate:  80   ECG rate assessment: normal     Rhythm: sinus rhythm     Ectopy: none     Conduction: normal   Comments:     Patient placed on cardiac monitor to evaluate for arrhythmias    MEDICATIONS ORDERED IN ED: Medications  sodium chloride 0.9 % bolus 500 mL (500 mLs Intravenous New Bag/Given 07/04/22 0611)  iohexol (OMNIPAQUE) 350 MG/ML injection 75 mL (75 mLs Intravenous Contrast Given 07/04/22 0544)     IMPRESSION / MDM / ASSESSMENT AND PLAN / ED COURSE  I reviewed the triage vital signs and the nursing notes.                             72 year old female presenting with chest pain. Differential diagnosis includes, but is not limited to, ACS, aortic dissection, pulmonary embolism, cardiac tamponade, pneumothorax, pneumonia, pericarditis, myocarditis, GI-related causes  including esophagitis/gastritis, and musculoskeletal chest wall pain.   I have personally reviewed patient's records and note an ENT visit on 05/17/2022 for ptosis.  Patient's presentation is most consistent with acute presentation with potential threat to life or bodily function.  The patient is on the cardiac monitor to evaluate for evidence of arrhythmia and/or significant heart rate changes.  Will obtain cardiac panel, chest x-ray.  Consider CTA chest to evaluate for PE given patient's history of COVID infection 3 months ago.  Patient is currently pain-free and  voices no medical complaints.  Will reassess.  Clinical Course as of 07/04/22 0655  Thu Jul 04, 2022  0456 Recycling blood pressure as patient is anxious and initial blood pressure upon arrival was not hypertensive. [JS]  0525 BP 148/77 [JS]  0537 Laboratories all demonstrate normal WBC 5.1, unremarkable electrolytes, initial troponin and chest x-ray unremarkable.  Will obtain CTA chest to evaluate for PE. [JS]  0618 CTA negative for PE.  Demonstrates bronchitis, single pulmonary nodule.  Updated patient of all results thus far.  She is resting in no acute distress, no complaints of pain.  Awaiting repeat troponin.  If negative, anticipate patient may be discharged home with cardiology and pulmonary nodule clinic follow-ups. [JS]  [id number] Patient transferred to Dr. Fuller Plan at change of shift pending repeat troponin. [JS]    Clinical Course User Index [JS] Irean Hong, MD     FINAL CLINICAL IMPRESSION(S) / ED DIAGNOSES   Final diagnoses:  Chest pain, unspecified type  Bronchitis  Pulmonary nodule     Rx / DC Orders   ED Discharge Orders          Ordered    AMB  Referral to Pulmonary Nodule Clinic        07/04/22 0618    Ambulatory referral to Cardiology       Comments: If you have not heard from the Cardiology office within the next 72 hours please call [phone].   07/04/22 0620    albuterol (VENTOLIN HFA) 108 (90  Base) MCG/ACT inhaler  Every 4 hours PRN        07/04/22 8295             Note:  This document was prepared using Dragon voice recognition software and may include unintentional dictation errors.   Irean Hong, MD 07/09/22 [phone]

## 2022-07-04 NOTE — Discharge Instructions (Addendum)
Use Albuterol inhaler 2 puffs every 4 hours as needed for difficulty breathing.  Return to the ER for worsening symptoms, persistent vomiting, difficulty breathing or other concerns.  IMPRESSION: 1. No evidence of arterial dilatation or embolus. 2. Cardiomegaly with mild left ventricular hypertrophy. 3. Aortic atherosclerosis.  No visible coronary plaques. 4. Diffuse bronchial thickening consistent with bronchitis. 5. Mosaic attenuation in the lower lobes which is usually due to small airways disease with air trapping and microatelectasis. Underlying pneumonitis is possible but less likely. 6. 3 mm right middle lobe nodule. Per Fleischner Society Guidelines, no routine follow-up imaging is recommended. These guidelines do not apply to immunocompromised patients and patients with cancer. Follow up in patients with significant comorbidities as clinically warranted. For lung cancer screening, adhere to Lung-RADS guidelines. Reference: Radiology. 2017; 284(1):228-43. 7. Small hiatal hernia with moderately thickened EG junction. Endoscopic follow-up recommended.   Aortic Atherosclerosis (ICD10-I70.0).

## 2022-07-24 DIAGNOSIS — M5416 Radiculopathy, lumbar region: Secondary | ICD-10-CM | POA: Diagnosis not present

## 2022-07-24 DIAGNOSIS — M9901 Segmental and somatic dysfunction of cervical region: Secondary | ICD-10-CM | POA: Diagnosis not present

## 2022-07-24 DIAGNOSIS — M9903 Segmental and somatic dysfunction of lumbar region: Secondary | ICD-10-CM | POA: Diagnosis not present

## 2022-07-24 DIAGNOSIS — M62830 Muscle spasm of back: Secondary | ICD-10-CM | POA: Diagnosis not present

## 2022-08-15 DIAGNOSIS — H02403 Unspecified ptosis of bilateral eyelids: Secondary | ICD-10-CM | POA: Diagnosis not present

## 2022-08-15 DIAGNOSIS — H02409 Unspecified ptosis of unspecified eyelid: Secondary | ICD-10-CM | POA: Diagnosis not present

## 2022-08-15 DIAGNOSIS — L987 Excessive and redundant skin and subcutaneous tissue: Secondary | ICD-10-CM | POA: Diagnosis not present

## 2022-08-15 DIAGNOSIS — H539 Unspecified visual disturbance: Secondary | ICD-10-CM | POA: Diagnosis not present

## 2022-08-15 DIAGNOSIS — H02831 Dermatochalasis of right upper eyelid: Secondary | ICD-10-CM | POA: Diagnosis not present

## 2022-08-15 DIAGNOSIS — H538 Other visual disturbances: Secondary | ICD-10-CM | POA: Diagnosis not present

## 2022-08-15 DIAGNOSIS — Z79899 Other long term (current) drug therapy: Secondary | ICD-10-CM | POA: Diagnosis not present

## 2022-08-15 DIAGNOSIS — H02834 Dermatochalasis of left upper eyelid: Secondary | ICD-10-CM | POA: Diagnosis not present

## 2022-08-20 DIAGNOSIS — M5416 Radiculopathy, lumbar region: Secondary | ICD-10-CM | POA: Diagnosis not present

## 2022-08-20 DIAGNOSIS — M9903 Segmental and somatic dysfunction of lumbar region: Secondary | ICD-10-CM | POA: Diagnosis not present

## 2022-08-20 DIAGNOSIS — M62830 Muscle spasm of back: Secondary | ICD-10-CM | POA: Diagnosis not present

## 2022-08-20 DIAGNOSIS — M9901 Segmental and somatic dysfunction of cervical region: Secondary | ICD-10-CM | POA: Diagnosis not present

## 2022-08-27 ENCOUNTER — Encounter: Payer: Self-pay | Admitting: Internal Medicine

## 2022-08-27 ENCOUNTER — Ambulatory Visit (INDEPENDENT_AMBULATORY_CARE_PROVIDER_SITE_OTHER): Payer: Medicare Other | Admitting: Internal Medicine

## 2022-08-27 VITALS — BP 130/84 | HR 82 | Temp 97.3°F | Ht 67.0 in | Wt 167.0 lb

## 2022-08-27 DIAGNOSIS — K2100 Gastro-esophageal reflux disease with esophagitis, without bleeding: Secondary | ICD-10-CM

## 2022-08-27 DIAGNOSIS — R918 Other nonspecific abnormal finding of lung field: Secondary | ICD-10-CM

## 2022-08-27 NOTE — Progress Notes (Signed)
[facility] Consultation      Date: 08/27/2022,   MRN# [id number] Christina Hammond 07/01/1950      CHIEF COMPLAINT:  Abnormal CT chest    HISTORY OF PRESENT ILLNESS  72 year old pleasant white female seen today for assessment of abnormal CT chest March 2024 patient woke up in the middle of the night with chest pressure Patient was seen in the ER and obtain a CT of the chest to rule out PE  CT of the chest reviewed in detail with the patient Patient seems to have bilateral mosaic pattern groundglass opacifications lower lobe predominant in the setting of a very minuscule subcentimeter right middle lobe nodule  Patient is a non-smoker nonalcoholic Patient has had COVID in January 2024 subsequent follow-up viral infection  CT scan also shows thickened GE junction consistent with severe GERD  At this time patient denies any cardiac or respiratory symptoms at this time Prior to her ER visit in March 2024 patient does not have any type of respiratory symptoms or cardiac symptoms  No wheezing no shortness of breath no cough Patient stays very active  Patient is a real state agent  No exacerbation at this time No evidence of heart failure at this time No evidence or signs of infection at this time No respiratory distress No fevers, chills, nausea, vomiting, diarrhea No evidence of lower extremity edema No evidence hemoptysis  Patient takes PPI for reflux Zyrtec as needed   PAST MEDICAL HISTORY   Past Medical History:  Diagnosis Date   Cancer (HCC)    basal cell,  [facility]    Restless legs syndrome (RLS)    managed with minimal meds     SURGICAL HISTORY   Past Surgical History:  Procedure Laterality Date   ABDOMINAL HYSTERECTOMY  1979   APPENDECTOMY  1956   CESAREAN SECTION     CHOLECYSTECTOMY       FAMILY HISTORY   Family History  Problem Relation Age of Onset   Diabetes Mother    Stroke Mother    Cancer Father    Heart disease  Father    Cancer Maternal Aunt    Mental retardation Maternal Aunt    Alcohol abuse Maternal Uncle    Breast cancer Cousin      SOCIAL HISTORY   Social History   Tobacco Use   Smoking status: Never   Smokeless tobacco: Never  Substance Use Topics   Alcohol use: Yes    Alcohol/week: 1.0 - 2.0 standard drink of alcohol    Types: 1 - 2 Glasses of wine per week    Comment: weekends, social    Drug use: No     MEDICATIONS    Home Medication:  Current Outpatient Rx   Order #: [id number] Class: Historical Med   Order #: [id number] Class: Print   Order #: [id number] Class: Historical Med   Order #: [id number] Class: Historical Med    Current Medication:  Current Outpatient Medications:    acetaminophen (TYLENOL) 325 MG tablet, Take 325 mg by mouth at bedtime., Disp: , Rfl:    albuterol (VENTOLIN HFA) 108 (90 Base) MCG/ACT inhaler, Inhale 2 puffs into the lungs every 4 (four) hours as needed for wheezing or shortness of breath., Disp: 18 g, Rfl: 0   diphenhydrAMINE (BENADRYL) 25 mg capsule, Take 25 mg by mouth at bedtime as needed for sleep, allergies or itching., Disp: , Rfl:    omeprazole (PRILOSEC) 20 MG capsule, Take 20 mg by mouth daily., Disp: , Rfl:  ALLERGIES   Lunesta [eszopiclone], Diclofenac, and Ibuprofen     REVIEW OF SYSTEMS    Review of Systems:  Gen:  Denies  fever, sweats, chills weigh loss  HEENT: Denies blurred vision, double vision, ear pain, eye pain, hearing loss, nose bleeds, sore throat Cardiac:  No dizziness, chest pain or heaviness, chest tightness,edema Resp:   Denies cough or sputum porduction, shortness of breath,wheezing, hemoptysis,  Gi: Denies swallowing difficulty, stomach pain, nausea or vomiting, diarrhea, constipation, bowel incontinence Gu:  Denies bladder incontinence, burning urine Ext:   Denies Joint pain, stiffness or swelling Skin: Denies  skin rash, easy bruising or bleeding or hives Endoc:  Denies polyuria, polydipsia ,  polyphagia or weight change Psych:   Denies depression, insomnia or hallucinations   Other:  All other systems negative  BP 130/84 (BP Location: Left Arm, Cuff Size: Normal)   Pulse 82   Temp (!) 97.3 F (36.3 C)   Ht 5\' 7"  (1.702 m)   Wt 167 lb (75.8 kg)   SpO2 100%   BMI 26.16 kg/m      PHYSICAL EXAM  General Appearance: No distress  EYES PERRLA, EOM intact.   NECK Supple, No JVD Pulmonary: normal breath sounds, No wheezing.  CardiovascularNormal S1,S2.  No m/r/g.   Abdomen: Benign, Soft, non-tender. Skin:   warm, no rashes, no ecchymosis  Extremities: normal, no cyanosis, clubbing. Neuro:without focal findings,  speech normal  PSYCHIATRIC: Mood, affect within normal limits.   ALL OTHER ROS ARE NEGATIVE      IMAGING      IMPRESSION: 1. No evidence of arterial dilatation or embolus. 2. Cardiomegaly with mild left ventricular hypertrophy. 3. Aortic atherosclerosis.  No visible coronary plaques. 4. Diffuse bronchial thickening consistent with bronchitis. 5. Mosaic attenuation in the lower lobes which is usually due to small airways disease with air trapping and microatelectasis. Underlying pneumonitis is possible but less likely. 6. 3 mm right middle lobe nodule. Per Fleischner Society Guidelines, no routine follow-up imaging is recommended. These guidelines do not apply to immunocompromised patients and patients with cancer. Follow up in patients with significant comorbidities as clinically warranted. For lung cancer screening, adhere to Lung-RADS guidelines. Reference: Radiology. 2017; 284(1):228-43. 7. Small hiatal hernia with moderately thickened EG junction. Endoscopic follow-up recommended.    ASSESSMENT/PLAN   72 year old pleasant white female seen today for follow-up assessment of abnormal CT chest in March 2024 with bilateral lower lobe predominant mosaic pattern groundglass opacifications with subcentimeter right middle lobe lung nodule in the  setting of previous COVID 19 infection with signs and symptoms of severe reflux disease  Recommend repeat CT chest in 6 months for interval changes Recommend GI referral for severe reflux disease No indication for antibiotics or steroids or inhalers at this time    MEDICATION ADJUSTMENTS/LABS AND TESTS ORDERED: Follow up in 6 months with CT Chest  Avoid secondhand smoke Avoid SICK contacts Recommend Keep up-to-date with vaccinations   Recommend GI referral for severe reflux   CURRENT MEDICATIONS REVIEWED AT LENGTH WITH PATIENT TODAY   Patient  satisfied with Plan of action and management. All questions answered  Follow up 6 months  Total Time Spent  45 mins   Christina Hammond Santiago Glad, M.D.  [facility]  Medical Director [facility] Medical Director [facility] Cardio-Pulmonary Department

## 2022-08-27 NOTE — Patient Instructions (Addendum)
Follow up in 6 months with CT Chest  Avoid secondhand smoke Avoid SICK contacts Recommend Keep up-to-date with vaccinations   Recommend GI referral for severe reflux

## 2022-09-02 ENCOUNTER — Telehealth: Payer: Self-pay

## 2022-09-02 NOTE — Telephone Encounter (Signed)
Patient is scheduled to see Dr. Azucena Cecil as a new patient on 09/10/22. LVM to inquire about any previous cardiac history.

## 2022-09-06 ENCOUNTER — Other Ambulatory Visit: Payer: Self-pay | Admitting: Internal Medicine

## 2022-09-06 DIAGNOSIS — Z1231 Encounter for screening mammogram for malignant neoplasm of breast: Secondary | ICD-10-CM

## 2022-09-10 ENCOUNTER — Encounter: Payer: Self-pay | Admitting: Cardiology

## 2022-09-10 ENCOUNTER — Ambulatory Visit: Payer: Medicare Other | Attending: Cardiology | Admitting: Cardiology

## 2022-09-10 VITALS — BP 150/90 | HR 86 | Ht 66.0 in | Wt 168.4 lb

## 2022-09-10 DIAGNOSIS — R030 Elevated blood-pressure reading, without diagnosis of hypertension: Secondary | ICD-10-CM | POA: Diagnosis not present

## 2022-09-10 DIAGNOSIS — R079 Chest pain, unspecified: Secondary | ICD-10-CM | POA: Diagnosis not present

## 2022-09-10 NOTE — Progress Notes (Signed)
Cardiology Office Note:    Date:  09/10/2022   ID:  Christina Hammond, DOB 05/28/1950, MRN [id number]  PCP:  Sherlene Shams, MD   [facility] Providers Cardiologist:  None     Referring MD: Irean Hong, MD   Chief Complaint  Patient presents with   New Patient (Initial Visit)    Referred for episode of chest pain/pressure with difficulty breathing during the night and did present ED.      History of Present Illness:    Christina Hammond is a 72 y.o. female with no significant cardiac history presenting with chest pain.  About 2 months ago, she woke up from sleep with chest tightness.  Symptoms lasted a few minutes and improved.  Her husband had a heart attack 3 years ago, patient was worried about her symptoms, prompted husband who took her to the emergency room.  Workup with EKG and troponins showed no acute coronary issues.  Denies having any symptoms since.  Feels well, blood pressures at home usually in the 120s to 130s.  Was running late to today's appointment.  Past Medical History:  Diagnosis Date   Cancer (HCC)    basal cell,  [facility]    Restless legs syndrome (RLS)    managed with minimal meds    Past Surgical History:  Procedure Laterality Date   ABDOMINAL HYSTERECTOMY  1979   APPENDECTOMY  1956   CESAREAN SECTION     CHOLECYSTECTOMY      Current Medications: Current Meds  Medication Sig   acetaminophen (TYLENOL) 325 MG tablet Take 325 mg by mouth at bedtime.   cetirizine (ZYRTEC) 5 MG chewable tablet Chew 5 mg by mouth daily.   diphenhydrAMINE (BENADRYL) 25 mg capsule Take 25 mg by mouth at bedtime as needed for sleep, allergies or itching.   omeprazole (PRILOSEC) 20 MG capsule Take 20 mg by mouth daily.     Allergies:   Fluconazole, Lunesta [eszopiclone], Diclofenac, Ibuprofen, and Tape   Social History   Socioeconomic History   Marital status: Married    Spouse name: Not on file   Number of children: Not on file   Years of  education: Not on file   Highest education level: Not on file  Occupational History   Occupation: real estate agent  Tobacco Use   Smoking status: Never   Smokeless tobacco: Never  Vaping Use   Vaping Use: Never used  Substance and Sexual Activity   Alcohol use: Yes    Alcohol/week: 1.0 - 2.0 standard drink of alcohol    Types: 1 - 2 Glasses of wine per week    Comment: weekends, social    Drug use: No   Sexual activity: Not on file  Other Topics Concern   Not on file  Social History Narrative   ** Merged History Encounter **       Social Determinants of Health   Financial Resource Strain: Low Risk  (06/12/2022)   Overall Financial Resource Strain (CARDIA)    Difficulty of Paying Living Expenses: Not hard at all  Food Insecurity: No Food Insecurity (06/12/2022)   Hunger Vital Sign    Worried About Running Out of Food in the Last Year: Never true    Ran Out of Food in the Last Year: Never true  Transportation Needs: No Transportation Needs (06/12/2022)   PRAPARE - Administrator, Civil Service (Medical): No    Lack of Transportation (Non-Medical): No  Physical Activity: Sufficiently  Active (06/12/2022)   Exercise Vital Sign    Days of Exercise per Week: 4 days    Minutes of Exercise per Session: 40 min  Stress: No Stress Concern Present (06/12/2022)   [employer] of Occupational Health - Occupational Stress Questionnaire    Feeling of Stress : Not at all  Social Connections: Unknown (06/12/2022)   Social Connection and Isolation Panel [NHANES]    Frequency of Communication with Friends and Family: More than three times a week    Frequency of Social Gatherings with Friends and Family: Twice a week    Attends Religious Services: Not on Marketing executive or Organizations: Yes    Attends Engineer, structural: More than 4 times per year    Marital Status: Married     Family History: The patient's family history includes Alcohol abuse in  her maternal uncle; Breast cancer in her cousin; Cancer in her father and maternal aunt; Diabetes in her mother; Heart disease in her father; Mental retardation in her maternal aunt; Stroke in her mother.  ROS:   Please see the history of present illness.     All other systems reviewed and are negative.  EKGs/Labs/Other Studies Reviewed:    The following studies were reviewed today:  EKG obtained today, shows normal sinus rhythm      Recent Labs: 02/12/2022: TSH 2.11 07/04/2022: ALT 15; BUN 19; Creatinine, Ser 0.74; Hemoglobin 13.6; Platelets 285; Potassium 3.4; Sodium 138  Recent Lipid Panel    Component Value Date/Time   CHOL 250 (H) 02/12/2022 0859   TRIG 134.0 02/12/2022 0859   HDL 63.00 02/12/2022 0859   CHOLHDL 4 02/12/2022 0859   VLDL 26.8 02/12/2022 0859   LDLCALC 160 (H) 02/12/2022 0859   LDLDIRECT 157.0 02/12/2022 0859     Risk Assessment/Calculations:    HYPERTENSION CONTROL Vitals:   09/10/22 0944 09/10/22 0947  BP: (!) 162/92 (!) 150/90    The patient's blood pressure is elevated above target today.  In order to address the patient's elevated BP: Blood pressure will be monitored at home to determine if medication changes need to be made.            Physical Exam:    VS:  BP (!) 150/90 (BP Location: Right Arm, Patient Position: Sitting, Cuff Size: Normal)   Pulse 86   Ht 5\' 6"  (1.676 m)   Wt 168 lb 6.4 oz (76.4 kg)   SpO2 100%   BMI 27.18 kg/m     Wt Readings from Last 3 Encounters:  09/10/22 168 lb 6.4 oz (76.4 kg)  08/27/22 167 lb (75.8 kg)  07/04/22 171 lb 8.3 oz (77.8 kg)     GEN:  Well nourished, well developed in no acute distress HEENT: Normal NECK: No JVD; No carotid bruits CARDIAC: RRR, no murmurs, rubs, gallops RESPIRATORY:  Clear to auscultation without rales, wheezing or rhonchi  ABDOMEN: Soft, non-tender, non-distended MUSCULOSKELETAL:  No edema; No deformity  SKIN: Warm and dry NEUROLOGIC:  Alert and oriented x  3 PSYCHIATRIC:  Normal affect   ASSESSMENT:    1. Chest pain of uncertain etiology   2. Elevated BP without diagnosis of hypertension    PLAN:    In order of problems listed above:  Chest pain, appears noncardiac, no further episodes since.  Obtain echo to rule out any structural abnormalities.  If echo is normal and patient has no further symptoms, no additional testing indicated. Elevated BP, usually controlled at  home.  Continue to monitor.  Follow-up after echo or as needed based on echo results/symptoms.     Medication Adjustments/Labs and Tests Ordered: Current medicines are reviewed at length with the patient today.  Concerns regarding medicines are outlined above.  Orders Placed This Encounter  Procedures   EKG 12-Lead   ECHOCARDIOGRAM COMPLETE   No orders of the defined types were placed in this encounter.   Patient Instructions  Medication Instructions:   Your physician recommends that you continue on your current medications as directed. Please refer to the Current Medication list given to you today.  *If you need a refill on your cardiac medications before your next appointment, please call your pharmacy*   Lab Work:  None Ordered  If you have labs (blood work) drawn today and your tests are completely normal, you will receive your results only by: MyChart Message (if you have MyChart) OR A paper copy in the mail If you have any lab test that is abnormal or we need to change your treatment, we will call you to review the results.   Testing/Procedures:  Your physician has requested that you have an echocardiogram. Echocardiography is a painless test that uses sound waves to create images of your heart. It provides your doctor with information about the size and shape of your heart and how well your heart's chambers and valves are working. This procedure takes approximately one hour. There are no restrictions for this procedure. Please do NOT wear cologne,  perfume, aftershave, or lotions (deodorant is allowed). Please arrive 15 minutes prior to your appointment time.     Follow-Up: At [facility], you and your health needs are our priority.  As part of our continuing mission to provide you with exceptional heart care, we have created designated Provider Care Teams.  These Care Teams include your primary Cardiologist (physician) and Advanced Practice Providers (APPs -  Physician Assistants and Nurse Practitioners) who all work together to provide you with the care you need, when you need it.  We recommend signing up for the patient portal called "MyChart".  Sign up information is provided on this After Visit Summary.  MyChart is used to connect with patients for Virtual Visits (Telemedicine).  Patients are able to view lab/test results, encounter notes, upcoming appointments, etc.  Non-urgent messages can be sent to your provider as well.   To learn more about what you can do with MyChart, go to [email or web address].    Your next appointment:    As needed    Signed, Debbe Odea, MD  09/10/2022 10:21 AM    [facility]

## 2022-09-10 NOTE — Patient Instructions (Signed)
Medication Instructions:   Your physician recommends that you continue on your current medications as directed. Please refer to the Current Medication list given to you today.  *If you need a refill on your cardiac medications before your next appointment, please call your pharmacy*   Lab Work:  None Ordered  If you have labs (blood work) drawn today and your tests are completely normal, you will receive your results only by: MyChart Message (if you have MyChart) OR A paper copy in the mail If you have any lab test that is abnormal or we need to change your treatment, we will call you to review the results.   Testing/Procedures:  Your physician has requested that you have an echocardiogram. Echocardiography is a painless test that uses sound waves to create images of your heart. It provides your doctor with information about the size and shape of your heart and how well your heart's chambers and valves are working. This procedure takes approximately one hour. There are no restrictions for this procedure. Please do NOT wear cologne, perfume, aftershave, or lotions (deodorant is allowed). Please arrive 15 minutes prior to your appointment time.     Follow-Up: At [facility], you and your health needs are our priority.  As part of our continuing mission to provide you with exceptional heart care, we have created designated Provider Care Teams.  These Care Teams include your primary Cardiologist (physician) and Advanced Practice Providers (APPs -  Physician Assistants and Nurse Practitioners) who all work together to provide you with the care you need, when you need it.  We recommend signing up for the patient portal called "MyChart".  Sign up information is provided on this After Visit Summary.  MyChart is used to connect with patients for Virtual Visits (Telemedicine).  Patients are able to view lab/test results, encounter notes, upcoming appointments, etc.  Non-urgent messages  can be sent to your provider as well.   To learn more about what you can do with MyChart, go to [email or web address].    Your next appointment:    As needed    

## 2022-09-17 DIAGNOSIS — M62830 Muscle spasm of back: Secondary | ICD-10-CM | POA: Diagnosis not present

## 2022-09-17 DIAGNOSIS — M9901 Segmental and somatic dysfunction of cervical region: Secondary | ICD-10-CM | POA: Diagnosis not present

## 2022-09-17 DIAGNOSIS — M9903 Segmental and somatic dysfunction of lumbar region: Secondary | ICD-10-CM | POA: Diagnosis not present

## 2022-09-17 DIAGNOSIS — M5416 Radiculopathy, lumbar region: Secondary | ICD-10-CM | POA: Diagnosis not present

## 2022-09-23 ENCOUNTER — Ambulatory Visit: Payer: Medicare Other | Attending: Cardiology

## 2022-09-23 DIAGNOSIS — R079 Chest pain, unspecified: Secondary | ICD-10-CM | POA: Insufficient documentation

## 2022-09-23 LAB — ECHOCARDIOGRAM COMPLETE
Area-P 1/2: 3.6 cm2
S' Lateral: 2.7 cm

## 2022-10-02 ENCOUNTER — Ambulatory Visit
Admission: RE | Admit: 2022-10-02 | Discharge: 2022-10-02 | Disposition: A | Discharge status: Home / Self Care | Payer: Medicare Other | Source: Ambulatory Visit | Attending: Internal Medicine | Admitting: Internal Medicine

## 2022-10-02 DIAGNOSIS — Z1231 Encounter for screening mammogram for malignant neoplasm of breast: Secondary | ICD-10-CM | POA: Diagnosis not present

## 2022-10-08 NOTE — Progress Notes (Unsigned)
Celso Amy, PA-C [address]  [address]  [place], [place] [address]  Main: [phone]  Fax: [phone]   Gastroenterology Consultation  Referring Provider:     Sherlene Shams, MD Primary Care Physician:  Sherlene Shams, MD Primary Gastroenterologist:  Celso Amy, PA-C / Dr. Lannette Donath   Reason for Consultation:     GERD        HPI:   Christina Hammond is a 72 y.o. y/o female referred for consultation & management  by Sherlene Shams, MD.    Patient has had mid upper chest discomfort several times in the past month.  Feels like a mild tightness or pressure.  She had a chest CT which showed thickening of the lower esophagus at the GE junction.  She has seen pulmonologist and cardiologist and was told to follow-up with GI for acid reflux.  Denies heartburn or chest pain.  Patient has had a lot of throat clearing and head congestion at nighttime.  She has had dry cough at night.  She started OTC omeprazole 20 mg once daily in the morning which helped her cough.  She denies dysphagia, nausea, vomiting, or unintentional weight loss.  Previous cholecystectomy.  Denies abdominal pain.  Negative colonoscopy in 2008.  Patient states she has had 2 negative Cologuard test done through her PCP in the past 6 years.  She declines to schedule any repeat colonoscopy procedures.  She denies any lower GI symptoms.  Past Medical History:  Diagnosis Date   Cancer (HCC)    basal cell,  [facility]    Restless legs syndrome (RLS)    managed with minimal meds    Past Surgical History:  Procedure Laterality Date   ABDOMINAL HYSTERECTOMY  1979   APPENDECTOMY  1956   CESAREAN SECTION     CHOLECYSTECTOMY      Prior to Admission medications   Medication Sig Start Date End Date Taking? Authorizing Provider  acetaminophen (TYLENOL) 325 MG tablet Take 325 mg by mouth at bedtime.    [provider]  cetirizine (ZYRTEC) 5 MG chewable tablet Chew 5 mg by mouth daily.    [provider]  diphenhydrAMINE (BENADRYL) 25 mg capsule Take 25 mg by mouth at bedtime as needed for sleep, allergies or itching.    [provider]  omeprazole (PRILOSEC) 20 MG capsule Take 20 mg by mouth daily.    [provider]    Family History  Problem Relation Age of Onset   Diabetes Mother    Stroke Mother    Cancer Father    Heart disease Father    Cancer Maternal Aunt    Mental retardation Maternal Aunt    Alcohol abuse Maternal Uncle    Breast cancer Cousin      Social History   Tobacco Use   Smoking status: Never   Smokeless tobacco: Never  Vaping Use   Vaping status: Never Used  Substance Use Topics   Alcohol use: Yes    Alcohol/week: 1.0 - 2.0 standard drink of alcohol    Types: 1 - 2 Glasses of wine per week    Comment: weekends, social    Drug use: No    Allergies as of 10/09/2022 - Review Complete 10/09/2022  Allergen Reaction Noted   Fluconazole Rash and Hives 10/11/2011   Lunesta [eszopiclone] Other (See Comments) 02/21/2022   Diclofenac Hives 10/11/2011   Ibuprofen Hives 02/13/2014   Tape  05/17/2022    Review of  Systems:    All systems reviewed and negative except where noted in HPI.   Physical Exam:  BP (!) 147/83   Pulse 82   Temp 97.8 F (36.6 C)   Ht 5\' 6"  (1.676 m)   Wt 168 lb 6.4 oz (76.4 kg)   BMI 27.18 kg/m  No LMP recorded. Patient has had a hysterectomy. Psych:  Alert and cooperative. Normal mood and affect. General:   Alert,  Well-developed, well-nourished, pleasant and cooperative in NAD Head:  Normocephalic and atraumatic. Eyes:  Sclera clear, no icterus.   Conjunctiva pink. Lungs:  Respirations even and unlabored.  Clear throughout to auscultation.   No wheezes, crackles, or rhonchi. No acute distress. Heart:  Regular rate and rhythm; no murmurs, clicks, rubs, or gallops. Abdomen:  Normal bowel sounds.  No bruits.  Soft, and non-distended without masses, hepatosplenomegaly or hernias noted.  No  Tenderness.  No guarding or rebound tenderness.    Neurologic:  Alert and oriented x3;  grossly normal neurologically. Psych:  Alert and cooperative. Normal mood and affect.  Imaging Studies: See HPI.  Assessment and Plan:   [name] is a 72 y.o. y/o female has been referred for:  1.  GERD - Manifested as chronic cough, globus sensation, throat clearing.  No heartburn.  Continue omeprazole 20 Mg once daily, take 30 minutes before dinner.  Recommend Lifestyle Modifications to prevent Acid Reflux.  Rec. Avoid coffee, sodas, peppermint, citrus fruits, and spicey foods.  Avoid eating 2-3 hours before bedtime.   2.  Abnormal CT scan of esophagus -thickening of lower esophagus; evaluate for Barrett's and esophagitis.  Scheduling EGD I discussed risks of EGD with patient to include risk of bleeding, perforation, and risk of sedation.  Patient expressed understanding and agrees to proceed with EGD.   3.  Colon cancer screening  Guidelines discussed.  Patient declined to schedule colonoscopy today.  She prefers to do screening Cologuard test through her PCP every 3 years.  She reports having 2 negative Cologuard test in the past 6 years.  Follow up as needed based on EGD results and symptoms.  Celso Amy, PA-C

## 2022-10-09 ENCOUNTER — Encounter: Payer: Self-pay | Admitting: Physician Assistant

## 2022-10-09 ENCOUNTER — Ambulatory Visit (INDEPENDENT_AMBULATORY_CARE_PROVIDER_SITE_OTHER): Payer: Medicare Other | Admitting: Physician Assistant

## 2022-10-09 VITALS — BP 125/82 | HR 82 | Temp 97.8°F | Ht 66.0 in | Wt 168.4 lb

## 2022-10-09 DIAGNOSIS — Z1211 Encounter for screening for malignant neoplasm of colon: Secondary | ICD-10-CM | POA: Diagnosis not present

## 2022-10-09 DIAGNOSIS — R933 Abnormal findings on diagnostic imaging of other parts of digestive tract: Secondary | ICD-10-CM | POA: Diagnosis not present

## 2022-10-09 DIAGNOSIS — K219 Gastro-esophageal reflux disease without esophagitis: Secondary | ICD-10-CM

## 2022-10-15 DIAGNOSIS — M9903 Segmental and somatic dysfunction of lumbar region: Secondary | ICD-10-CM | POA: Diagnosis not present

## 2022-10-15 DIAGNOSIS — M9901 Segmental and somatic dysfunction of cervical region: Secondary | ICD-10-CM | POA: Diagnosis not present

## 2022-10-15 DIAGNOSIS — M62830 Muscle spasm of back: Secondary | ICD-10-CM | POA: Diagnosis not present

## 2022-10-15 DIAGNOSIS — M5416 Radiculopathy, lumbar region: Secondary | ICD-10-CM | POA: Diagnosis not present

## 2022-11-19 DIAGNOSIS — M9903 Segmental and somatic dysfunction of lumbar region: Secondary | ICD-10-CM | POA: Diagnosis not present

## 2022-11-19 DIAGNOSIS — M9901 Segmental and somatic dysfunction of cervical region: Secondary | ICD-10-CM | POA: Diagnosis not present

## 2022-11-19 DIAGNOSIS — M62830 Muscle spasm of back: Secondary | ICD-10-CM | POA: Diagnosis not present

## 2022-11-19 DIAGNOSIS — M5416 Radiculopathy, lumbar region: Secondary | ICD-10-CM | POA: Diagnosis not present

## 2022-11-20 ENCOUNTER — Ambulatory Visit: Payer: Medicare Other | Admitting: Certified Registered"

## 2022-11-20 ENCOUNTER — Ambulatory Visit
Admission: RE | Admit: 2022-11-20 | Discharge: 2022-11-20 | Disposition: A | Discharge status: Home / Self Care | Payer: Medicare Other | Attending: Gastroenterology | Admitting: Gastroenterology

## 2022-11-20 ENCOUNTER — Encounter
Admission: RE | Disposition: A | Discharge status: Home / Self Care | Payer: Self-pay | Source: Home / Self Care | Attending: Gastroenterology

## 2022-11-20 ENCOUNTER — Other Ambulatory Visit: Payer: Self-pay

## 2022-11-20 ENCOUNTER — Encounter: Payer: Self-pay | Admitting: Gastroenterology

## 2022-11-20 DIAGNOSIS — R933 Abnormal findings on diagnostic imaging of other parts of digestive tract: Secondary | ICD-10-CM

## 2022-11-20 DIAGNOSIS — K2100 Gastro-esophageal reflux disease with esophagitis, without bleeding: Secondary | ICD-10-CM | POA: Diagnosis not present

## 2022-11-20 DIAGNOSIS — K449 Diaphragmatic hernia without obstruction or gangrene: Secondary | ICD-10-CM | POA: Diagnosis not present

## 2022-11-20 DIAGNOSIS — K209 Esophagitis, unspecified without bleeding: Secondary | ICD-10-CM | POA: Diagnosis not present

## 2022-11-20 HISTORY — PX: ESOPHAGOGASTRODUODENOSCOPY (EGD) WITH PROPOFOL: SHX5813

## 2022-11-20 HISTORY — PX: BIOPSY: SHX5522

## 2022-11-20 SURGERY — ESOPHAGOGASTRODUODENOSCOPY (EGD) WITH PROPOFOL
Anesthesia: General

## 2022-11-20 MED ORDER — LIDOCAINE HCL (CARDIAC) PF 100 MG/5ML IV SOSY
PREFILLED_SYRINGE | INTRAVENOUS | Status: DC | PRN
  Administered 2022-11-20 (×2): 100 mg via INTRAVENOUS

## 2022-11-20 MED ORDER — PROPOFOL 10 MG/ML IV BOLUS
INTRAVENOUS | Status: DC | PRN
  Administered 2022-11-20: 50 mg via INTRAVENOUS

## 2022-11-20 MED ORDER — OMEPRAZOLE 40 MG PO CPDR: 40.0000 mg | DELAYED_RELEASE_CAPSULE | Freq: Two times a day (BID) | ORAL | 2 refills | Status: DC

## 2022-11-20 MED ORDER — SODIUM CHLORIDE 0.9 % IV SOLN: INTRAVENOUS | Status: DC

## 2022-11-20 NOTE — Transfer of Care (Signed)
Immediate Anesthesia Transfer of Care Note  Patient: [name]  Procedure(s) Performed: ESOPHAGOGASTRODUODENOSCOPY (EGD) WITH PROPOFOL BIOPSY  Patient Location: PACU  Anesthesia Type:General  Level of Consciousness: awake  Airway & Oxygen Therapy: Patient Spontanous Breathing  Post-op Assessment: Report given to RN and Post -op Vital signs reviewed and stable  Post vital signs: stable  Last Vitals:  Vitals Value Taken Time  BP    Temp    Pulse 80 11/20/22 0846  Resp    SpO2 94 % 11/20/22 0846  Vitals shown include unfiled device data.  Last Pain:  Vitals:   11/20/22 0725  TempSrc: Temporal  PainSc: 0-No pain         Complications: No notable events documented.

## 2022-11-20 NOTE — Anesthesia Postprocedure Evaluation (Signed)
Anesthesia Post Note  Patient: Christina Hammond  Procedure(s) Performed: ESOPHAGOGASTRODUODENOSCOPY (EGD) WITH PROPOFOL BIOPSY  Patient location during evaluation: Endoscopy Anesthesia Type: General Level of consciousness: awake and alert Pain management: pain level controlled Vital Signs Assessment: post-procedure vital signs reviewed and stable Respiratory status: spontaneous breathing, nonlabored ventilation, respiratory function stable and patient connected to nasal cannula oxygen Cardiovascular status: blood pressure returned to baseline and stable Postop Assessment: no apparent nausea or vomiting Anesthetic complications: no  There were no known notable events for this encounter.   Last Vitals:  Vitals:   11/20/22 0857 11/20/22 0907  BP: (!) 147/71 135/86  Pulse: 80 74  Resp: 14 19  Temp:    SpO2: 98% 98%    Last Pain:  Vitals:   11/20/22 0907  TempSrc:   PainSc: 0-No pain                 Stephanie Coup

## 2022-11-20 NOTE — H&P (Signed)
Arlyss Repress, MD [address]  [address]  [place], [place] [address]  Main: [phone]  Fax: [phone] Pager: [phone]  Primary Care Physician:  Sherlene Shams, MD Primary Gastroenterologist:  Dr. Arlyss Repress  Pre-Procedure History & Physical: HPI:  Christina Hammond is a 72 y.o. female is here for an endoscopy.   Past Medical History:  Diagnosis Date   Cancer (HCC)    basal cell,  [facility]    Restless legs syndrome (RLS)    managed with minimal meds    Past Surgical History:  Procedure Laterality Date   ABDOMINAL HYSTERECTOMY  03/18/1977   APPENDECTOMY  03/18/1954   CESAREAN SECTION     CHOLECYSTECTOMY     COLONOSCOPY     x2    Prior to Admission medications   Medication Sig Start Date End Date Taking? Authorizing Provider  cetirizine (ZYRTEC) 5 MG chewable tablet Chew 5 mg by mouth daily.   Yes [provider]  fluticasone (FLONASE) 50 MCG/ACT nasal spray Place 2 sprays into both nostrils daily.   Yes [provider]  omeprazole (PRILOSEC) 20 MG capsule Take 20 mg by mouth daily.   Yes [provider]  acetaminophen (TYLENOL) 325 MG tablet Take 325 mg by mouth at bedtime.    [provider]  diphenhydrAMINE (BENADRYL) 25 mg capsule Take 25 mg by mouth at bedtime as needed for sleep, allergies or itching.    [provider]    Allergies as of 10/09/2022 - Review Complete 10/09/2022  Allergen Reaction Noted   Fluconazole Rash and Hives 10/11/2011   Lunesta [eszopiclone] Other (See Comments) 02/21/2022   Diclofenac Hives 10/11/2011   Ibuprofen Hives 02/13/2014   Tape  05/17/2022    Family History  Problem Relation Age of Onset   Diabetes Mother    Stroke Mother    Cancer Father    Heart disease Father    Cancer Maternal Aunt    Mental retardation Maternal Aunt    Alcohol abuse Maternal Uncle    Breast cancer Cousin     Social History   Socioeconomic History   Marital status: Married     Spouse name: Not on file   Number of children: Not on file   Years of education: Not on file   Highest education level: Not on file  Occupational History   Occupation: real estate agent  Tobacco Use   Smoking status: Never   Smokeless tobacco: Never  Vaping Use   Vaping status: Never Used  Substance and Sexual Activity   Alcohol use: Yes    Alcohol/week: 1.0 - 2.0 standard drink of alcohol    Types: 1 - 2 Glasses of wine per week    Comment: weekends, social .1 glass wine 6pm   Drug use: No   Sexual activity: Not on file  Other Topics Concern   Not on file  Social History Narrative   ** Merged History Encounter **    Live with husband   Social Determinants of Health   Financial Resource Strain: Low Risk  (06/12/2022)   Overall Financial Resource Strain (CARDIA)    Difficulty of Paying Living Expenses: Not hard at all  Food Insecurity: No Food Insecurity (06/12/2022)   Hunger Vital Sign    Worried About Running Out of Food in the Last Year: Never true    Ran Out of Food in the Last Year: Never true  Transportation Needs: No Transportation Needs (06/12/2022)   PRAPARE - Transportation  Lack of Transportation (Medical): No    Lack of Transportation (Non-Medical): No  Physical Activity: Sufficiently Active (06/12/2022)   Exercise Vital Sign    Days of Exercise per Week: 4 days    Minutes of Exercise per Session: 40 min  Stress: No Stress Concern Present (06/12/2022)   [employer] of Occupational Health - Occupational Stress Questionnaire    Feeling of Stress : Not at all  Social Connections: Unknown (06/12/2022)   Social Connection and Isolation Panel [NHANES]    Frequency of Communication with Friends and Family: More than three times a week    Frequency of Social Gatherings with Friends and Family: Twice a week    Attends Religious Services: Not on Insurance claims handler of Clubs or Organizations: Yes    Attends Banker Meetings: More than 4 times per  year    Marital Status: Married  Catering manager Violence: Not At Risk (06/13/2022)   Humiliation, Afraid, Rape, and Kick questionnaire    Fear of Current or Ex-Partner: No    Emotionally Abused: No    Physically Abused: No    Sexually Abused: No    Review of Systems: See HPI, otherwise negative ROS  Physical Exam: BP 133/74   Pulse 76   Temp (!) 96.9 F (36.1 C) (Temporal)   Resp 14   Ht 5\' 6"  (1.676 m)   Wt 76.6 kg   SpO2 98%   BMI 27.25 kg/m  General:   Alert,  pleasant and cooperative in NAD Head:  Normocephalic and atraumatic. Neck:  Supple; no masses or thyromegaly. Lungs:  Clear throughout to auscultation.    Heart:  Regular rate and rhythm. Abdomen:  Soft, nontender and nondistended. Normal bowel sounds, without guarding, and without rebound.   Neurologic:  Alert and  oriented x4;  grossly normal neurologically.  Impression/Plan: Christina Hammond is here for an endoscopy to be performed for Abnormal CT scan of esophagus -thickening of lower esophagus; evaluate for esophagitis.   Risks, benefits, limitations, and alternatives regarding  endoscopy have been reviewed with the patient.  Questions have been answered.  All parties agreeable.   Christina Donath, MD  11/20/2022, 7:49 AM

## 2022-11-20 NOTE — Anesthesia Preprocedure Evaluation (Signed)
Anesthesia Evaluation  Patient identified by MRN, date of birth, ID band Patient awake    Reviewed: Allergy & Precautions, NPO status , Patient's Chart, lab work & pertinent test results  Airway Mallampati: III  TM Distance: >3 FB Neck ROM: full    Dental  (+) Chipped, Dental Advidsory Given   Pulmonary neg pulmonary ROS   Pulmonary exam normal        Cardiovascular negative cardio ROS Normal cardiovascular exam     Neuro/Psych negative neurological ROS  negative psych ROS   GI/Hepatic negative GI ROS, Neg liver ROS,GERD  Medicated,,  Endo/Other  negative endocrine ROS    Renal/GU negative Renal ROS  negative genitourinary   Musculoskeletal   Abdominal   Peds  Hematology negative hematology ROS (+)   Anesthesia Other Findings Past Medical History: No date: Cancer (HCC)     Comment:  basal cell,  [facility]  No date: Restless legs syndrome (RLS)     Comment:  managed with minimal meds  Past Surgical History: 03/18/1977: ABDOMINAL HYSTERECTOMY 03/18/1954: APPENDECTOMY No date: CESAREAN SECTION No date: CHOLECYSTECTOMY No date: COLONOSCOPY     Comment:  x2  BMI    Body Mass Index: 27.25 kg/m      Reproductive/Obstetrics negative OB ROS                             Anesthesia Physical Anesthesia Plan  ASA: 2  Anesthesia Plan: General   Post-op Pain Management: Minimal or no pain anticipated   Induction: Intravenous  PONV Risk Score and Plan: 3 and Propofol infusion, TIVA and Ondansetron  Airway Management Planned: Nasal Cannula  Additional Equipment: None  Intra-op Plan:   Post-operative Plan:   Informed Consent: I have reviewed the patients History and Physical, chart, labs and discussed the procedure including the risks, benefits and alternatives for the proposed anesthesia with the patient or authorized representative who has indicated his/her understanding and  acceptance.     Dental advisory given  Plan Discussed with: CRNA and Surgeon  Anesthesia Plan Comments: (Discussed risks of anesthesia with patient, including possibility of difficulty with spontaneous ventilation under anesthesia necessitating airway intervention, PONV, and rare risks such as cardiac or respiratory or neurological events, and allergic reactions. Discussed the role of CRNA in patient's perioperative care. Patient understands.)       Anesthesia Quick Evaluation

## 2022-11-20 NOTE — Op Note (Signed)
[facility] Gastroenterology Patient Name: Christina Hammond Procedure Date: 11/20/2022 8:33 AM MRN: [id number] Account #: [id number] Date of Birth: 07-Jun-1950 Admit Type: Outpatient Age: 72 Room: [facility] ENDO ROOM 4 Gender: Female Note Status: Finalized Instrument Name: Upper Endoscope [id number] Procedure:             Upper GI endoscopy Indications:           Follow-up of gastro-esophageal reflux disease,                         Abnormal CT of the GI tract Providers:             Toney Reil MD, MD Medicines:             General Anesthesia Complications:         No immediate complications. Estimated blood loss: None. Procedure:             Pre-Anesthesia Assessment:                        - Prior to the procedure, a History and Physical was                         performed, and patient medications and allergies were                         reviewed. The patient is competent. The risks and                         benefits of the procedure and the sedation options and                         risks were discussed with the patient. All questions                         were answered and informed consent was obtained.                         Patient identification and proposed procedure were                         verified by the physician, the nurse, the                         anesthesiologist, the anesthetist and the technician                         in the pre-procedure area in the procedure room in the                         endoscopy suite. Mental Status Examination: alert and                         oriented. Airway Examination: normal oropharyngeal                         airway and neck mobility. Respiratory Examination:  clear to auscultation. CV Examination: normal.                         Prophylactic Antibiotics: The patient does not require                         prophylactic antibiotics. Prior Anticoagulants: The                          patient has taken no anticoagulant or antiplatelet                         agents. ASA Grade Assessment: II - A patient with mild                         systemic disease. After reviewing the risks and                         benefits, the patient was deemed in satisfactory                         condition to undergo the procedure. The anesthesia                         plan was to use general anesthesia. Immediately prior                         to administration of medications, the patient was                         re-assessed for adequacy to receive sedatives. The                         heart rate, respiratory rate, oxygen saturations,                         blood pressure, adequacy of pulmonary ventilation, and                         response to care were monitored throughout the                         procedure. The physical status of the patient was                         re-assessed after the procedure.                        After obtaining informed consent, the endoscope was                         passed under direct vision. Throughout the procedure,                         the patient's blood pressure, pulse, and oxygen                         saturations were monitored continuously. The Endoscope  was introduced through the mouth, and advanced to the                         second part of duodenum. The upper GI endoscopy was                         accomplished without difficulty. The patient tolerated                         the procedure well. Findings:      The duodenal bulb and second portion of the duodenum were normal.      The entire examined stomach was normal.      The cardia and gastric fundus were normal on retroflexion.      A 1 cm hiatal hernia was present.      Esophagogastric landmarks were identified: the gastroesophageal junction       was found at 35 cm from the incisors.      LA Grade C (one or more mucosal breaks  continuous between tops of 2 or       more mucosal folds, less than 75% circumference) esophagitis with no       bleeding was found at the gastroesophageal junction.      The examined esophagus was normal. Biopsies were taken with a cold       forceps for histology. Impression:            - Normal duodenal bulb and second portion of the                         duodenum.                        - Normal stomach.                        - 1 cm hiatal hernia.                        - Esophagogastric landmarks identified.                        - LA Grade C reflux esophagitis with no bleeding.                        - Normal esophagus. Biopsied. Recommendation:        - Await pathology results.                        - Discharge patient to home (with escort).                        - Resume previous diet today.                        - Continue present medications.                        - Follow an antireflux regimen indefinitely.                        - Use a proton pump inhibitor PO BID for 3 months then  daily. Procedure Code(s):     --- Professional ---                        [phone], Esophagogastroduodenoscopy, flexible,                         transoral; with biopsy, single or multiple Diagnosis Code(s):     --- Professional ---                        K44.9, Diaphragmatic hernia without obstruction or                         gangrene                        K21.00, Gastro-esophageal reflux disease with                         esophagitis, without bleeding                        R93.3, Abnormal findings on diagnostic imaging of                         other parts of digestive tract CPT copyright 2022 American Medical Association. All rights reserved. The codes documented in this report are preliminary and upon coder review may  be revised to meet current compliance requirements. Dr. Libby Maw Toney Reil MD, MD 11/20/2022 8:45:21 AM This report has been  signed electronically. Number of Addenda: 0 Note Initiated On: 11/20/2022 8:33 AM Estimated Blood Loss:  Estimated blood loss: none.      [facility]

## 2022-11-21 ENCOUNTER — Encounter: Payer: Self-pay | Admitting: Gastroenterology

## 2022-11-21 DIAGNOSIS — Z23 Encounter for immunization: Secondary | ICD-10-CM | POA: Diagnosis not present

## 2022-11-22 ENCOUNTER — Telehealth: Payer: Self-pay

## 2022-11-22 NOTE — Telephone Encounter (Signed)
Patient verbalized understanding of results. She states she has had some bloating after the EGD but it is getting better. Told her to let us know if it start to get worse or anything

## 2022-11-22 NOTE — Telephone Encounter (Signed)
-----   Message from [facility] sent at 11/21/2022 10:40 PM EDT ----- Pathology results from upper endoscopy came back normal.  She has erosive esophagitis on EGD.  Recommend to continue omeprazole 40 mg twice daily as prescribed for 3 months followed by once a day long-term  RV

## 2022-11-25 DIAGNOSIS — L819 Disorder of pigmentation, unspecified: Secondary | ICD-10-CM | POA: Diagnosis not present

## 2022-12-10 DIAGNOSIS — M9901 Segmental and somatic dysfunction of cervical region: Secondary | ICD-10-CM | POA: Diagnosis not present

## 2022-12-10 DIAGNOSIS — M62830 Muscle spasm of back: Secondary | ICD-10-CM | POA: Diagnosis not present

## 2022-12-10 DIAGNOSIS — M5416 Radiculopathy, lumbar region: Secondary | ICD-10-CM | POA: Diagnosis not present

## 2022-12-10 DIAGNOSIS — M9903 Segmental and somatic dysfunction of lumbar region: Secondary | ICD-10-CM | POA: Diagnosis not present

## 2022-12-13 DIAGNOSIS — D045 Carcinoma in situ of skin of trunk: Secondary | ICD-10-CM | POA: Diagnosis not present

## 2022-12-13 DIAGNOSIS — L570 Actinic keratosis: Secondary | ICD-10-CM | POA: Diagnosis not present

## 2022-12-13 DIAGNOSIS — Z808 Family history of malignant neoplasm of other organs or systems: Secondary | ICD-10-CM | POA: Diagnosis not present

## 2022-12-13 DIAGNOSIS — L812 Freckles: Secondary | ICD-10-CM | POA: Diagnosis not present

## 2022-12-13 DIAGNOSIS — D235 Other benign neoplasm of skin of trunk: Secondary | ICD-10-CM | POA: Diagnosis not present

## 2022-12-13 DIAGNOSIS — D225 Melanocytic nevi of trunk: Secondary | ICD-10-CM | POA: Diagnosis not present

## 2022-12-13 DIAGNOSIS — L821 Other seborrheic keratosis: Secondary | ICD-10-CM | POA: Diagnosis not present

## 2022-12-13 DIAGNOSIS — Z85828 Personal history of other malignant neoplasm of skin: Secondary | ICD-10-CM | POA: Diagnosis not present

## 2022-12-13 DIAGNOSIS — D0439 Carcinoma in situ of skin of other parts of face: Secondary | ICD-10-CM | POA: Diagnosis not present

## 2022-12-13 DIAGNOSIS — D485 Neoplasm of uncertain behavior of skin: Secondary | ICD-10-CM | POA: Diagnosis not present

## 2023-01-14 DIAGNOSIS — D485 Neoplasm of uncertain behavior of skin: Secondary | ICD-10-CM | POA: Diagnosis not present

## 2023-01-14 DIAGNOSIS — M5416 Radiculopathy, lumbar region: Secondary | ICD-10-CM | POA: Diagnosis not present

## 2023-01-14 DIAGNOSIS — M62830 Muscle spasm of back: Secondary | ICD-10-CM | POA: Diagnosis not present

## 2023-01-14 DIAGNOSIS — D235 Other benign neoplasm of skin of trunk: Secondary | ICD-10-CM | POA: Diagnosis not present

## 2023-01-14 DIAGNOSIS — M9903 Segmental and somatic dysfunction of lumbar region: Secondary | ICD-10-CM | POA: Diagnosis not present

## 2023-01-14 DIAGNOSIS — D225 Melanocytic nevi of trunk: Secondary | ICD-10-CM | POA: Diagnosis not present

## 2023-01-14 DIAGNOSIS — M9901 Segmental and somatic dysfunction of cervical region: Secondary | ICD-10-CM | POA: Diagnosis not present

## 2023-01-15 DIAGNOSIS — R0981 Nasal congestion: Secondary | ICD-10-CM | POA: Diagnosis not present

## 2023-01-15 DIAGNOSIS — J309 Allergic rhinitis, unspecified: Secondary | ICD-10-CM | POA: Diagnosis not present

## 2023-01-22 ENCOUNTER — Encounter: Payer: Self-pay | Admitting: Internal Medicine

## 2023-01-22 DIAGNOSIS — N390 Urinary tract infection, site not specified: Secondary | ICD-10-CM | POA: Diagnosis not present

## 2023-01-28 DIAGNOSIS — D045 Carcinoma in situ of skin of trunk: Secondary | ICD-10-CM | POA: Diagnosis not present

## 2023-01-28 DIAGNOSIS — D0439 Carcinoma in situ of skin of other parts of face: Secondary | ICD-10-CM | POA: Diagnosis not present

## 2023-01-28 DIAGNOSIS — L570 Actinic keratosis: Secondary | ICD-10-CM | POA: Diagnosis not present

## 2023-01-28 DIAGNOSIS — L578 Other skin changes due to chronic exposure to nonionizing radiation: Secondary | ICD-10-CM | POA: Diagnosis not present

## 2023-01-28 DIAGNOSIS — Z85828 Personal history of other malignant neoplasm of skin: Secondary | ICD-10-CM | POA: Diagnosis not present

## 2023-01-29 ENCOUNTER — Encounter: Payer: Self-pay | Admitting: Internal Medicine

## 2023-01-30 NOTE — Telephone Encounter (Signed)
Pt has been scheduled.  °

## 2023-02-04 ENCOUNTER — Ambulatory Visit (INDEPENDENT_AMBULATORY_CARE_PROVIDER_SITE_OTHER): Payer: Medicare Other | Admitting: Internal Medicine

## 2023-02-04 VITALS — BP 148/88 | HR 84 | Ht 66.0 in | Wt 167.4 lb

## 2023-02-04 DIAGNOSIS — K2100 Gastro-esophageal reflux disease with esophagitis, without bleeding: Secondary | ICD-10-CM | POA: Diagnosis not present

## 2023-02-04 DIAGNOSIS — E559 Vitamin D deficiency, unspecified: Secondary | ICD-10-CM | POA: Diagnosis not present

## 2023-02-04 DIAGNOSIS — Z79899 Other long term (current) drug therapy: Secondary | ICD-10-CM

## 2023-02-04 DIAGNOSIS — E782 Mixed hyperlipidemia: Secondary | ICD-10-CM | POA: Diagnosis not present

## 2023-02-04 DIAGNOSIS — I10 Essential (primary) hypertension: Secondary | ICD-10-CM | POA: Insufficient documentation

## 2023-02-04 DIAGNOSIS — K449 Diaphragmatic hernia without obstruction or gangrene: Secondary | ICD-10-CM | POA: Diagnosis not present

## 2023-02-04 DIAGNOSIS — M62830 Muscle spasm of back: Secondary | ICD-10-CM | POA: Diagnosis not present

## 2023-02-04 DIAGNOSIS — R7301 Impaired fasting glucose: Secondary | ICD-10-CM | POA: Diagnosis not present

## 2023-02-04 DIAGNOSIS — R933 Abnormal findings on diagnostic imaging of other parts of digestive tract: Secondary | ICD-10-CM

## 2023-02-04 DIAGNOSIS — R5383 Other fatigue: Secondary | ICD-10-CM | POA: Diagnosis not present

## 2023-02-04 DIAGNOSIS — M5416 Radiculopathy, lumbar region: Secondary | ICD-10-CM | POA: Diagnosis not present

## 2023-02-04 DIAGNOSIS — M9903 Segmental and somatic dysfunction of lumbar region: Secondary | ICD-10-CM | POA: Diagnosis not present

## 2023-02-04 DIAGNOSIS — M9901 Segmental and somatic dysfunction of cervical region: Secondary | ICD-10-CM | POA: Diagnosis not present

## 2023-02-04 MED ORDER — AMLODIPINE BESYLATE 2.5 MG PO TABS: 2.5000 mg | ORAL_TABLET | Freq: Every day | ORAL | 1 refills | Status: DC

## 2023-02-04 NOTE — Progress Notes (Signed)
Subjective:  Patient ID: Christina Hammond, female    DOB: [birth year]  Age: 72 y.o. MRN: [id number]  CC: The primary encounter diagnosis was Long-term use of high-risk medication. Diagnoses of Mixed hyperlipidemia, Other fatigue, Impaired fasting glucose, Essential hypertension, Vitamin D deficiency, Abnormal CT scan, esophagus, and Hiatal hernia with GERD and esophagitis were also pertinent to this visit.   HPI Christina Hammond presents for  Chief Complaint  Patient presents with   Medical Management of Chronic Issues    Follow up on blood pressure     72 YR OLD FEMALE LAST SEEN in office Dec 2023 presents with one  year history of  fluctuating blood pressures  She is not taking NSAIDS. Limits alcohol to weekends.     Takes a CBG gummy for insomnia.  Takes tylenol and benadryl as well  Had a nocturnal episode of chest heaviness , was evaluated in ER and ruled out for AMI,  ruled in for hiatal hernia  and CT chest showed ground glass pattern Willow Ora referred her to GI for severe reflux'  taking omeprazole for the past 2  months.   Has  infrequent nausea  that lasts 30 sec or less  History of Nocturnal cough noticed by patient one year ago self treated for GERD and cough resolved.   Repeat CT chest needs to be done next month     Outpatient Medications Prior to Visit  Medication Sig Dispense Refill   acetaminophen (TYLENOL) 325 MG tablet Take 325 mg by mouth at bedtime.     cetirizine (ZYRTEC) 5 MG chewable tablet Chew 5 mg by mouth daily.     diphenhydrAMINE (BENADRYL) 25 mg capsule Take 25 mg by mouth at bedtime as needed for sleep, allergies or itching.     fluticasone (FLONASE) 50 MCG/ACT nasal spray Place 2 sprays into both nostrils daily.     omeprazole (PRILOSEC) 40 MG capsule Take 1 capsule (40 mg total) by mouth 2 (two) times daily before a meal. 60 capsule 2   No facility-administered medications prior to visit.    Review of Systems;  Patient denies  headache, fevers, malaise, unintentional weight loss, skin rash, eye pain, sinus congestion and sinus pain, sore throat, dysphagia,  hemoptysis , cough, dyspnea, wheezing, chest pain, palpitations, orthopnea, edema, abdominal pain, nausea, melena, diarrhea, constipation, flank pain, dysuria, hematuria, urinary  Frequency, nocturia, numbness, tingling, seizures,  Focal weakness, Loss of consciousness,  Tremor, insomnia, depression, anxiety, and suicidal ideation.      Objective:  BP (!) 148/88   Pulse 84   Ht 5\' 6"  (1.676 m)   Wt 167 lb 6.4 oz (75.9 kg)   SpO2 99%   BMI 27.02 kg/m   BP Readings from Last 3 Encounters:  02/04/23 (!) 148/88  11/20/22 135/86  10/09/22 125/82    Wt Readings from Last 3 Encounters:  02/04/23 167 lb 6.4 oz (75.9 kg)  11/20/22 168 lb 12.8 oz (76.6 kg)  10/09/22 168 lb 6.4 oz (76.4 kg)    Physical Exam Vitals reviewed.  Constitutional:      General: She is not in acute distress.    Appearance: Normal appearance. She is normal weight. She is not ill-appearing, toxic-appearing or diaphoretic.  HENT:     Head: Normocephalic.  Eyes:     General: No scleral icterus.       Right eye: No discharge.        Left eye: No discharge.     Conjunctiva/sclera: Conjunctivae normal.  Cardiovascular:     Rate and Rhythm: Normal rate and regular rhythm.     Heart sounds: Normal heart sounds.  Pulmonary:     Effort: Pulmonary effort is normal. No respiratory distress.     Breath sounds: Normal breath sounds.  Musculoskeletal:        General: Normal range of motion.  Skin:    General: Skin is warm and dry.  Neurological:     General: No focal deficit present.     Mental Status: She is alert and oriented to person, place, and time. Mental status is at baseline.  Psychiatric:        Mood and Affect: Mood normal.        Behavior: Behavior normal.        Thought Content: Thought content normal.        Judgment: Judgment normal.    Lab Results  Component Value  Date   HGBA1C 6.2 02/12/2022    Lab Results  Component Value Date   CREATININE 0.74 07/04/2022   CREATININE 0.66 02/12/2022   CREATININE 0.78 04/02/2017    Lab Results  Component Value Date   WBC 5.1 07/04/2022   HGB 13.6 07/04/2022   HCT 43.8 07/04/2022   PLT 285 07/04/2022   GLUCOSE 107 (H) 07/04/2022   CHOL 250 (H) 02/12/2022   TRIG 134.0 02/12/2022   HDL 63.00 02/12/2022   LDLDIRECT 157.0 02/12/2022   LDLCALC 160 (H) 02/12/2022   ALT 15 07/04/2022   AST 18 07/04/2022   NA 138 07/04/2022   K 3.4 (L) 07/04/2022   CL 103 07/04/2022   CREATININE 0.74 07/04/2022   BUN 19 07/04/2022   CO2 27 07/04/2022   TSH 2.11 02/12/2022   INR 1.0 04/17/2022   HGBA1C 6.2 02/12/2022    No results found.  Assessment & Plan:  .Long-term use of high-risk medication -     B12 and Folate Panel; Future  Mixed hyperlipidemia -     Lipid panel; Future -     LDL cholesterol, direct; Future  Other fatigue -     TSH; Future -     CBC with Differential/Platelet; Future  Impaired fasting glucose -     Comprehensive metabolic panel; Future -     Hemoglobin A1c; Future  Essential hypertension Assessment & Plan: Starting amlodipine 2.5 mg daily  .  BMET and urinalysis for protein ordered.    Orders: -     Comprehensive metabolic panel; Future -     Microalbumin / creatinine urine ratio; Future  Vitamin D deficiency -     VITAMIN D 25 Hydroxy (Vit-D Deficiency, Fractures); Future  Abnormal CT scan, esophagus Assessment & Plan: Hiatal hernia and ground glass opacities  noted. May be reflux related.  She has been on PPI therapy fpr the past month.  Repeat CT in December   Hiatal hernia with GERD and esophagitis Assessment & Plan: Continue PPI bid,  then every day as directed by GI.  HH explained in detil using patient's  recent EGD report    Other orders -     amLODIPine Besylate; Take 1 tablet (2.5 mg total) by mouth daily.  Dispense: 90 tablet; Refill: 1     I provided  42 minutes of face-to-face time during this encounter reviewing patient's last visit with me, patient's  most recent ER visit ,  visit with pulmonology  cardiology,    recent surgical and non surgical procedures, previous  labs and imaging studies, counseling on currently  addressed issues,  and post visit ordering to diagnostics and therapeutics .   Follow-up: Return in about 3 months (around 05/07/2023).   Sherlene Shams, MD

## 2023-02-04 NOTE — Patient Instructions (Addendum)
Start amlodipine 2.5 mg daily   Goal is 110/70 to 130/80     Send me readings from Day 7 TO 14  Return for labs and urine testing at leisure   If reflux symptoms return after reducing omeprazole to once daily,  add the second dose back  EVENTUALLY YOU MAY BE ABLE TO SWITCH TO FAMOTIDINE.  TIME WILL TELL

## 2023-02-04 NOTE — Assessment & Plan Note (Signed)
Hiatal hernia and ground glass opacities  noted. May be reflux related.  She has been on PPI therapy fpr the past month.  Repeat CT in December

## 2023-02-04 NOTE — Assessment & Plan Note (Signed)
Starting amlodipine 2.5 mg daily  .  BMET and urinalysis for protein ordered.

## 2023-02-04 NOTE — Assessment & Plan Note (Signed)
Continue PPI bid,  then every day as directed by GI.  HH explained in detil using patient's  recent EGD report

## 2023-02-05 ENCOUNTER — Ambulatory Visit: Payer: Medicare Other | Admitting: Internal Medicine

## 2023-02-10 ENCOUNTER — Other Ambulatory Visit (INDEPENDENT_AMBULATORY_CARE_PROVIDER_SITE_OTHER): Payer: Medicare Other

## 2023-02-10 DIAGNOSIS — E538 Deficiency of other specified B group vitamins: Secondary | ICD-10-CM

## 2023-02-10 DIAGNOSIS — R5383 Other fatigue: Secondary | ICD-10-CM | POA: Diagnosis not present

## 2023-02-10 DIAGNOSIS — E782 Mixed hyperlipidemia: Secondary | ICD-10-CM | POA: Diagnosis not present

## 2023-02-10 DIAGNOSIS — Z79899 Other long term (current) drug therapy: Secondary | ICD-10-CM | POA: Diagnosis not present

## 2023-02-10 DIAGNOSIS — R7301 Impaired fasting glucose: Secondary | ICD-10-CM

## 2023-02-10 DIAGNOSIS — E559 Vitamin D deficiency, unspecified: Secondary | ICD-10-CM

## 2023-02-10 DIAGNOSIS — D702 Other drug-induced agranulocytosis: Secondary | ICD-10-CM

## 2023-02-10 DIAGNOSIS — I10 Essential (primary) hypertension: Secondary | ICD-10-CM

## 2023-02-10 LAB — LIPID PANEL
Cholesterol: 243 mg/dL — ABNORMAL HIGH (ref 0–200)
HDL: 51.4 mg/dL (ref >39.00)
LDL Cholesterol: 162 mg/dL — ABNORMAL HIGH (ref 0–99)
NonHDL: 192.07
Total CHOL/HDL Ratio: 5
Triglycerides: 148 mg/dL (ref 0.0–149.0)
VLDL: 29.6 mg/dL (ref 0.0–40.0)

## 2023-02-10 LAB — MICROALBUMIN / CREATININE URINE RATIO
Creatinine,U: 108.1 mg/dL
Microalb Creat Ratio: 0.6 mg/g (ref 0.0–30.0)
Microalb, Ur: <0.7 mg/dL (ref 0.0–1.9)

## 2023-02-10 LAB — CBC WITH DIFFERENTIAL/PLATELET
Basophils Absolute: 0 10*3/uL (ref 0.0–0.1)
Basophils Relative: 0.6 % (ref 0.0–3.0)
Eosinophils Absolute: 0.1 10*3/uL (ref 0.0–0.7)
Eosinophils Relative: 3 % (ref 0.0–5.0)
HCT: 42.9 % (ref 36.0–46.0)
Hemoglobin: 14 g/dL (ref 12.0–15.0)
Lymphocytes Relative: 45.5 % (ref 12.0–46.0)
Lymphs Abs: 1.3 10*3/uL (ref 0.7–4.0)
MCHC: 32.6 g/dL (ref 30.0–36.0)
MCV: 89.7 fL (ref 78.0–100.0)
Monocytes Absolute: 0.3 10*3/uL (ref 0.1–1.0)
Monocytes Relative: 11 % (ref 3.0–12.0)
Neutro Abs: 1.1 10*3/uL — ABNORMAL LOW (ref 1.4–7.7)
Neutrophils Relative %: 39.9 % — ABNORMAL LOW (ref 43.0–77.0)
Platelets: 278 10*3/uL (ref 150.0–400.0)
RBC: 4.79 Mil/uL (ref 3.87–5.11)
RDW: 13.5 % (ref 11.5–15.5)
WBC: 2.8 10*3/uL — ABNORMAL LOW (ref 4.0–10.5)

## 2023-02-10 LAB — COMPREHENSIVE METABOLIC PANEL
ALT: 13 U/L (ref 0–35)
AST: 14 U/L (ref 0–37)
Albumin: 4.1 g/dL (ref 3.5–5.2)
Alkaline Phosphatase: 59 U/L (ref 39–117)
BUN: 15 mg/dL (ref 6–23)
CO2: 31 meq/L (ref 19–32)
Calcium: 9 mg/dL (ref 8.4–10.5)
Chloride: 102 meq/L (ref 96–112)
Creatinine, Ser: 0.74 mg/dL (ref 0.40–1.20)
GFR: 79.49 mL/min (ref >60.00)
Glucose, Bld: 95 mg/dL (ref 70–99)
Potassium: 4.1 meq/L (ref 3.5–5.1)
Sodium: 140 meq/L (ref 135–145)
Total Bilirubin: 0.3 mg/dL (ref 0.2–1.2)
Total Protein: 6.5 g/dL (ref 6.0–8.3)

## 2023-02-10 LAB — HEMOGLOBIN A1C: Hgb A1c MFr Bld: 6.2 % (ref 4.6–6.5)

## 2023-02-10 LAB — LDL CHOLESTEROL, DIRECT: Direct LDL: 156 mg/dL

## 2023-02-10 LAB — TSH: TSH: 1.58 u[IU]/mL (ref 0.35–5.50)

## 2023-02-10 LAB — B12 AND FOLATE PANEL
Folate: 12 ng/mL (ref >5.9)
Vitamin B-12: 175 pg/mL — ABNORMAL LOW (ref 211–911)

## 2023-02-10 LAB — VITAMIN D 25 HYDROXY (VIT D DEFICIENCY, FRACTURES): VITD: 24.8 ng/mL — ABNORMAL LOW (ref 30.00–100.00)

## 2023-02-11 DIAGNOSIS — E538 Deficiency of other specified B group vitamins: Secondary | ICD-10-CM | POA: Insufficient documentation

## 2023-02-11 DIAGNOSIS — D709 Neutropenia, unspecified: Secondary | ICD-10-CM | POA: Insufficient documentation

## 2023-02-11 MED ORDER — ERGOCALCIFEROL 1.25 MG (50000 UT) PO CAPS: 50000.0000 [IU] | ORAL_CAPSULE | ORAL | 0 refills | Status: DC

## 2023-02-11 NOTE — Assessment & Plan Note (Signed)
Secondary to b12 deficiency (presumed)  will recheck in 4 weeks

## 2023-02-11 NOTE — Addendum Note (Signed)
Addended by: Sherlene Shams on: 02/11/2023 09:13 AM   Modules accepted: Orders

## 2023-02-11 NOTE — Assessment & Plan Note (Signed)
B12 deficiency noted, with neutropenia

## 2023-02-11 NOTE — Progress Notes (Signed)
Cahrt updated with lab results

## 2023-02-12 ENCOUNTER — Ambulatory Visit (INDEPENDENT_AMBULATORY_CARE_PROVIDER_SITE_OTHER): Payer: Medicare Other

## 2023-02-12 DIAGNOSIS — E538 Deficiency of other specified B group vitamins: Secondary | ICD-10-CM | POA: Diagnosis not present

## 2023-02-12 MED ORDER — CYANOCOBALAMIN 1000 MCG/ML IJ SOLN
1000.0000 ug | Freq: Once | INTRAMUSCULAR | Status: AC
  Administered 2023-02-12: 1000 ug via INTRAMUSCULAR

## 2023-02-12 NOTE — Progress Notes (Signed)
Pt presented for their vitamin B12 injection. Pt was identified through two identifiers. Pt tolerated shot well in their left  deltoid.  

## 2023-02-16 ENCOUNTER — Other Ambulatory Visit: Payer: Self-pay | Admitting: Gastroenterology

## 2023-02-17 NOTE — Telephone Encounter (Signed)
Last office visit 10/09/2022 GERD  Path results ----- Message from Lannette Donath sent at 11/21/2022 10:40 PM EDT ----- Pathology results from upper endoscopy came back normal.  She has erosive esophagitis on EGD.  Recommend to continue omeprazole 40 mg twice daily as prescribed for 3 months followed by once a day long-term   RV  Last refill 11/20/2022 60 2 refills

## 2023-02-18 ENCOUNTER — Ambulatory Visit
Admission: RE | Admit: 2023-02-18 | Discharge: 2023-02-18 | Disposition: A | Discharge status: Home / Self Care | Payer: Medicare Other | Source: Ambulatory Visit | Attending: Internal Medicine | Admitting: Internal Medicine

## 2023-02-18 DIAGNOSIS — I700 Atherosclerosis of aorta: Secondary | ICD-10-CM | POA: Diagnosis not present

## 2023-02-18 DIAGNOSIS — R918 Other nonspecific abnormal finding of lung field: Secondary | ICD-10-CM | POA: Diagnosis not present

## 2023-02-18 DIAGNOSIS — R911 Solitary pulmonary nodule: Secondary | ICD-10-CM | POA: Diagnosis not present

## 2023-02-19 ENCOUNTER — Ambulatory Visit (INDEPENDENT_AMBULATORY_CARE_PROVIDER_SITE_OTHER): Payer: Medicare Other

## 2023-02-19 DIAGNOSIS — E538 Deficiency of other specified B group vitamins: Secondary | ICD-10-CM

## 2023-02-19 MED ORDER — CYANOCOBALAMIN 1000 MCG/ML IJ SOLN
1000.0000 ug | Freq: Once | INTRAMUSCULAR | Status: AC
  Administered 2023-02-19: 1000 ug via INTRAMUSCULAR

## 2023-02-19 NOTE — Progress Notes (Signed)
Patient presented for B 12 injection to left deltoid, patient voiced no concerns nor showed any signs of distress during injection. 

## 2023-02-21 LAB — INTRINSIC FACTOR ANTIBODIES: Intrinsic Factor: NEGATIVE

## 2023-02-26 ENCOUNTER — Ambulatory Visit: Payer: Medicare Other

## 2023-02-26 DIAGNOSIS — E538 Deficiency of other specified B group vitamins: Secondary | ICD-10-CM

## 2023-02-26 MED ORDER — CYANOCOBALAMIN 1000 MCG/ML IJ SOLN
1000.0000 ug | Freq: Once | INTRAMUSCULAR | Status: AC
  Administered 2023-02-26: 1000 ug via INTRAMUSCULAR

## 2023-02-26 NOTE — Progress Notes (Signed)
Patient presented for B 12 injection to right deltoid, patient voiced no concerns nor showed any signs of distress during injection. 

## 2023-03-04 DIAGNOSIS — M9903 Segmental and somatic dysfunction of lumbar region: Secondary | ICD-10-CM | POA: Diagnosis not present

## 2023-03-04 DIAGNOSIS — M9901 Segmental and somatic dysfunction of cervical region: Secondary | ICD-10-CM | POA: Diagnosis not present

## 2023-03-04 DIAGNOSIS — M62830 Muscle spasm of back: Secondary | ICD-10-CM | POA: Diagnosis not present

## 2023-03-04 DIAGNOSIS — M5416 Radiculopathy, lumbar region: Secondary | ICD-10-CM | POA: Diagnosis not present

## 2023-03-06 ENCOUNTER — Ambulatory Visit: Payer: Medicare Other

## 2023-03-07 ENCOUNTER — Ambulatory Visit (INDEPENDENT_AMBULATORY_CARE_PROVIDER_SITE_OTHER): Payer: Medicare Other

## 2023-03-07 DIAGNOSIS — E538 Deficiency of other specified B group vitamins: Secondary | ICD-10-CM

## 2023-03-07 MED ORDER — CYANOCOBALAMIN 1000 MCG/ML IJ SOLN
1000.0000 ug | Freq: Once | INTRAMUSCULAR | Status: AC
  Administered 2023-03-07: 1000 ug via INTRAMUSCULAR

## 2023-03-07 NOTE — Progress Notes (Signed)
Patient presented for B 12 injection to left deltoid, patient voiced no concerns nor showed any signs of distress during injection. 

## 2023-03-08 ENCOUNTER — Other Ambulatory Visit: Payer: Self-pay | Admitting: Gastroenterology

## 2023-03-10 NOTE — Telephone Encounter (Signed)
EGD results 11/21/2022 10:40 PM EDT ----- Pathology results from upper endoscopy came back normal.  She has erosive esophagitis on EGD.  Recommend to continue omeprazole 40 mg twice daily as prescribed for 3 months followed by once a day long-term   RV  sending for once a day

## 2023-03-19 HISTORY — PX: OTHER SURGICAL HISTORY: SHX169

## 2023-03-27 ENCOUNTER — Ambulatory Visit (INDEPENDENT_AMBULATORY_CARE_PROVIDER_SITE_OTHER): Payer: Medicare Other | Admitting: Internal Medicine

## 2023-03-27 ENCOUNTER — Encounter: Payer: Self-pay | Admitting: Internal Medicine

## 2023-03-27 VITALS — BP 124/60 | HR 91 | Temp 97.7°F | Ht 66.0 in | Wt 171.4 lb

## 2023-03-27 DIAGNOSIS — R911 Solitary pulmonary nodule: Secondary | ICD-10-CM

## 2023-03-27 DIAGNOSIS — R918 Other nonspecific abnormal finding of lung field: Secondary | ICD-10-CM

## 2023-03-27 DIAGNOSIS — Z9189 Other specified personal risk factors, not elsewhere classified: Secondary | ICD-10-CM

## 2023-03-27 NOTE — Progress Notes (Signed)
 [facility] Pulmonary Medicine Consultation      Date: 03/27/2023,   MRN# [id number] Christina Hammond 13-Jul-1950    CHIEF COMPLAINT:  Abnormal CT chest    HISTORY OF PRESENT ILLNESS  PREVIOUS HISTORY Follow up assessment of abnormal CT chest March 2024 patient woke up in the middle of the night with chest pressure Patient was seen in the ER and obtain a CT of the chest to rule out PE  CT of the chest reviewed in detail with the patient Patient seems to have bilateral mosaic pattern groundglass opacifications lower lobe predominant in the setting of a very minuscule subcentimeter right middle lobe nodule  Patient is a non-smoker nonalcoholic Patient has had COVID in January 2024 subsequent follow-up viral infection  CT scan also shows thickened GE junction consistent with severe GERD  At this time patient denies any cardiac or respiratory symptoms at this time Prior to her ER visit in March 2024 patient does not have any type of respiratory symptoms or cardiac symptoms  No exacerbation at this time No evidence of heart failure at this time No evidence or signs of infection at this time No respiratory distress No fevers, chills, nausea, vomiting, diarrhea No evidence of lower extremity edema No evidence hemoptysis   No wheezing no shortness of breath no cough Patient stays very active  Patient is a real state agent  Patient takes PPI for reflux Zyrtec as needed  CT CHEST SCANS REVIEWED IN DETAIL TODAY  WITH PATIENT    PAST MEDICAL HISTORY   Past Medical History:  Diagnosis Date   Cancer (HCC)    basal cell,  [facility]    Restless legs syndrome (RLS)    managed with minimal meds     SURGICAL HISTORY   Past Surgical History:  Procedure Laterality Date   ABDOMINAL HYSTERECTOMY  03/18/1977   APPENDECTOMY  03/18/1954   BIOPSY  11/20/2022   Procedure: BIOPSY;  Surgeon: Unk Corinn Skiff, MD;  Location: [facility] ENDOSCOPY;  Service: Gastroenterology;;    CESAREAN SECTION     CHOLECYSTECTOMY     COLONOSCOPY     x2   ESOPHAGOGASTRODUODENOSCOPY (EGD) WITH PROPOFOL  N/A 11/20/2022   Procedure: ESOPHAGOGASTRODUODENOSCOPY (EGD) WITH PROPOFOL ;  Surgeon: Unk Corinn Skiff, MD;  Location: [facility] ENDOSCOPY;  Service: Gastroenterology;  Laterality: N/A;     FAMILY HISTORY   Family History  Problem Relation Age of Onset   Diabetes Mother    Stroke Mother    Cancer Father    Heart disease Father    Cancer Maternal Aunt    Mental retardation Maternal Aunt    Alcohol abuse Maternal Uncle    Breast cancer Cousin      SOCIAL HISTORY   Social History   Tobacco Use   Smoking status: Never   Smokeless tobacco: Never  Vaping Use   Vaping status: Never Used  Substance Use Topics   Alcohol use: Yes    Alcohol/week: 1.0 - 2.0 standard drink of alcohol    Types: 1 - 2 Glasses of wine per week    Comment: weekends, social .1 glass wine 6pm   Drug use: No     MEDICATIONS    Home Medication:  Current Outpatient Rx   Order #: [id number] Class: Historical Med   Order #: [id number] Class: Normal   Order #: [id number] Class: Historical Med   Order #: [id number] Class: Historical Med   Order #: [id number] Class: Normal   Order #: [id number] Class: Historical Med   Order #: [id number] Class: Normal  Current Medication:  Current Outpatient Medications:    acetaminophen (TYLENOL) 325 MG tablet, Take 325 mg by mouth at bedtime., Disp: , Rfl:    amLODipine  (NORVASC ) 2.5 MG tablet, Take 1 tablet (2.5 mg total) by mouth daily., Disp: 90 tablet, Rfl: 1   cetirizine (ZYRTEC) 5 MG chewable tablet, Chew 5 mg by mouth daily., Disp: , Rfl:    diphenhydrAMINE (BENADRYL) 25 mg capsule, Take 25 mg by mouth at bedtime as needed for sleep, allergies or itching., Disp: , Rfl:    ergocalciferol  (DRISDOL ) 1.25 MG (50000 UT) capsule, Take 1 capsule (50,000 Units total) by mouth once a week., Disp: 12 capsule, Rfl: 0   fluticasone  (FLONASE ) 50 MCG/ACT nasal spray, Place 2  sprays into both nostrils daily., Disp: , Rfl:    omeprazole  (PRILOSEC) 40 MG capsule, Take 1 capsule (40 mg total) by mouth daily., Disp: 90 capsule, Rfl: 2    ALLERGIES   Fluconazole, Lunesta  [eszopiclone ], Diclofenac, Ibuprofen, and Tape    BP 124/60 (BP Location: Right Arm, Patient Position: Sitting, Cuff Size: Normal)   Pulse 91   Temp 97.7 F (36.5 C) (Temporal)   Ht 5' 6 (1.676 m)   Wt 171 lb 6.4 oz (77.7 kg)   SpO2 95%   BMI 27.66 kg/m    Review of Systems: Gen:  Denies  fever, sweats, chills weight loss  HEENT: Denies blurred vision, double vision, ear pain, eye pain, hearing loss, nose bleeds, sore throat Cardiac:  No dizziness, chest pain or heaviness, chest tightness,edema, No JVD Resp:   No cough, -sputum production, -shortness of breath,-wheezing, -hemoptysis,  Other:  All other systems negative   Physical Examination:   General Appearance: No distress  EYES PERRLA, EOM intact.   NECK Supple, No JVD Pulmonary: normal breath sounds, No wheezing.  CardiovascularNormal S1,S2.  No m/r/g.   Abdomen: Benign, Soft, non-tender. Neurology UE/LE 5/5 strength, no focal deficits Ext pulses intact, cap refill intact ALL OTHER ROS ARE NEGATIVE      IMAGING      IMPRESSION: 1. No evidence of arterial dilatation or embolus. 2. Cardiomegaly with mild left ventricular hypertrophy. 3. Aortic atherosclerosis.  No visible coronary plaques. 4. Diffuse bronchial thickening consistent with bronchitis. 5. Mosaic attenuation in the lower lobes which is usually due to small airways disease with air trapping and microatelectasis. Underlying pneumonitis is possible but less likely. 6. 3 mm right middle lobe nodule. Per Fleischner Society Guidelines, no routine follow-up imaging is recommended. These guidelines do not apply to immunocompromised patients and patients with cancer. Follow up in patients with significant comorbidities as clinically warranted. For lung  cancer screening, adhere to Lung-RADS guidelines. Reference: Radiology. 2017; 284(1):228-43. 7. Small hiatal hernia with moderately thickened EG junction. Endoscopic follow-up recommended.       CT chest 02/2023 was Independently Reviewed By Me Today   No significant changes from previous CAT scan Recommend 1 year follow-up   ASSESSMENT/PLAN   73 year old pleasant white female seen today for follow-up assessment of abnormal CT chest in March 2024 with bilateral lower lobe predominant mosaic pattern groundglass opacifications with subcentimeter right middle lobe lung nodule in the setting of previous COVID 19 infection with signs and symptoms of severe reflux disease  Overall findings consistent with benign findings of the lung No significant changes in her CT scan Recommend follow-up CT chest in 1 year and assess for further CT scans as needed   MEDICATION ADJUSTMENTS/LABS AND TESTS ORDERED: Follow up in 1 year with CT Chest  Avoid secondhand smoke Avoid SICK contacts Recommend Keep up-to-date with vaccinations Recommend GI referral for severe reflux   CURRENT MEDICATIONS REVIEWED AT LENGTH WITH PATIENT TODAY   Patient  satisfied with Plan of action and management. All questions answered  Follow up 1 year  Total Time Spent  35 mins   Keiley Levey Alm Cellar, M.D.  [facility]  Medical Director [facility] Medical Director [facility] Cardio-Pulmonary Department

## 2023-03-27 NOTE — Patient Instructions (Signed)
 CT Chest in 1 year  Avoid Allergens and Irritants Avoid secondhand smoke Avoid SICK contacts Recommend  Masking  when appropriate Recommend Keep up-to-date with vaccinations

## 2023-04-01 DIAGNOSIS — M62830 Muscle spasm of back: Secondary | ICD-10-CM | POA: Diagnosis not present

## 2023-04-01 DIAGNOSIS — M5416 Radiculopathy, lumbar region: Secondary | ICD-10-CM | POA: Diagnosis not present

## 2023-04-01 DIAGNOSIS — M9901 Segmental and somatic dysfunction of cervical region: Secondary | ICD-10-CM | POA: Diagnosis not present

## 2023-04-01 DIAGNOSIS — M9903 Segmental and somatic dysfunction of lumbar region: Secondary | ICD-10-CM | POA: Diagnosis not present

## 2023-04-07 ENCOUNTER — Ambulatory Visit: Payer: Medicare Other

## 2023-04-07 DIAGNOSIS — E538 Deficiency of other specified B group vitamins: Secondary | ICD-10-CM

## 2023-04-07 MED ORDER — CYANOCOBALAMIN 1000 MCG/ML IJ SOLN
1000.0000 ug | Freq: Once | INTRAMUSCULAR | Status: AC
  Administered 2023-04-07: 1000 ug via INTRAMUSCULAR

## 2023-04-07 NOTE — Progress Notes (Signed)
Pt presented for their vitamin B12 injection. Pt was identified through two identifiers. Pt tolerated shot well in their right deltoid.  

## 2023-04-10 DIAGNOSIS — D0439 Carcinoma in situ of skin of other parts of face: Secondary | ICD-10-CM | POA: Diagnosis not present

## 2023-04-10 DIAGNOSIS — Z85828 Personal history of other malignant neoplasm of skin: Secondary | ICD-10-CM | POA: Insufficient documentation

## 2023-05-06 DIAGNOSIS — M9901 Segmental and somatic dysfunction of cervical region: Secondary | ICD-10-CM | POA: Diagnosis not present

## 2023-05-06 DIAGNOSIS — M5416 Radiculopathy, lumbar region: Secondary | ICD-10-CM | POA: Diagnosis not present

## 2023-05-06 DIAGNOSIS — M62830 Muscle spasm of back: Secondary | ICD-10-CM | POA: Diagnosis not present

## 2023-05-06 DIAGNOSIS — M9903 Segmental and somatic dysfunction of lumbar region: Secondary | ICD-10-CM | POA: Diagnosis not present

## 2023-05-07 ENCOUNTER — Telehealth (INDEPENDENT_AMBULATORY_CARE_PROVIDER_SITE_OTHER): Payer: Medicare Other | Admitting: Internal Medicine

## 2023-05-07 ENCOUNTER — Encounter: Payer: Self-pay | Admitting: Internal Medicine

## 2023-05-07 VITALS — BP 104/79 | HR 81 | Ht 66.0 in | Wt 161.0 lb

## 2023-05-07 DIAGNOSIS — E538 Deficiency of other specified B group vitamins: Secondary | ICD-10-CM | POA: Diagnosis not present

## 2023-05-07 DIAGNOSIS — R933 Abnormal findings on diagnostic imaging of other parts of digestive tract: Secondary | ICD-10-CM

## 2023-05-07 DIAGNOSIS — E559 Vitamin D deficiency, unspecified: Secondary | ICD-10-CM | POA: Diagnosis not present

## 2023-05-07 DIAGNOSIS — I10 Essential (primary) hypertension: Secondary | ICD-10-CM | POA: Diagnosis not present

## 2023-05-07 DIAGNOSIS — Z1231 Encounter for screening mammogram for malignant neoplasm of breast: Secondary | ICD-10-CM | POA: Diagnosis not present

## 2023-05-07 MED ORDER — ERGOCALCIFEROL 1.25 MG (50000 UT) PO CAPS: 50000.0000 [IU] | ORAL_CAPSULE | ORAL | 0 refills | Status: DC

## 2023-05-07 MED ORDER — AMLODIPINE BESYLATE 2.5 MG PO TABS: 2.5000 mg | ORAL_TABLET | Freq: Every day | ORAL | 1 refills | Status: DC

## 2023-05-07 NOTE — Progress Notes (Unsigned)
Virtual Visit via Caregility   Note   This format is felt to be most appropriate for this patient at this time.  All issues noted in this document were discussed and addressed.  No physical exam was performed (except for noted visual exam findings with Video Visits).   I connected with Christina Hammond on 05/07/23 at  2:30 PM EST by a video enabled telemedicine application  and verified that I am speaking with the correct person using two identifiers. Location patient: home Location provider: work or home office Persons participating in the virtual visit: patient, provider  I discussed the limitations, risks, security and privacy concerns of performing an evaluation and management service by telephone and the availability of in person appointments. I also discussed with the patient that there may be a patient responsible charge related to this service. The patient expressed understanding and agreed to proceed.   Reason for visit: follow up on hypertension   HPI:  REFERRED TO PULMONARY FOR ABNORMAL CT CHEST. Repeat CT  chest dec 2024 3 mm unchanged (sees kasa annually) Erosive esophagitis by sept 2024 EGD ,  ppi bid x 3,  then daily Htn started on amlodipine in November   B12 deficency treated IF ab negative Vit d deficiency noted      ROS: See pertinent positives and negatives per HPI.  Past Medical History:  Diagnosis Date   Cancer (HCC)    basal cell,  [facility]    Restless legs syndrome (RLS)    managed with minimal meds    Past Surgical History:  Procedure Laterality Date   ABDOMINAL HYSTERECTOMY  03/18/1977   APPENDECTOMY  03/18/1954   BIOPSY  11/20/2022   Procedure: BIOPSY;  Surgeon: Toney Reil, MD;  Location: [facility] ENDOSCOPY;  Service: Gastroenterology;;   CESAREAN SECTION     CHOLECYSTECTOMY     COLONOSCOPY     x2   ESOPHAGOGASTRODUODENOSCOPY (EGD) WITH PROPOFOL N/A 11/20/2022   Procedure: ESOPHAGOGASTRODUODENOSCOPY (EGD) WITH PROPOFOL;  Surgeon: Toney Reil,  MD;  Location: [facility] ENDOSCOPY;  Service: Gastroenterology;  Laterality: N/A;   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    Family History  Problem Relation Age of Onset   Diabetes Mother    Stroke Mother    Cancer Father    Heart disease Father    Cancer Maternal Aunt    Mental retardation Maternal Aunt    Alcohol abuse Maternal Uncle    Breast cancer Cousin     SOCIAL HX: ***   Current Outpatient Medications:    acetaminophen (TYLENOL) 325 MG tablet, Take 325 mg by mouth at bedtime., Disp: , Rfl:    amLODipine (NORVASC) 2.5 MG tablet, Take 1 tablet (2.5 mg total) by mouth daily., Disp: 90 tablet, Rfl: 1   cetirizine (ZYRTEC) 5 MG chewable tablet, Chew 5 mg by mouth daily., Disp: , Rfl:    diphenhydrAMINE (BENADRYL) 25 mg capsule, Take 25 mg by mouth at bedtime as needed for sleep, allergies or itching., Disp: , Rfl:    fluticasone (FLONASE) 50 MCG/ACT nasal spray, Place 2 sprays into both nostrils daily., Disp: , Rfl:    omeprazole (PRILOSEC) 40 MG capsule, Take 1 capsule (40 mg total) by mouth daily., Disp: 90 capsule, Rfl: 2  EXAM:  VITALS per patient if applicable:  GENERAL: alert, oriented, appears well and in no acute distress  HEENT: atraumatic, conjunttiva clear, no obvious abnormalities on inspection of external nose and ears  NECK: normal movements of  the head and neck  LUNGS: on inspection no signs of respiratory distress, breathing rate appears normal, no obvious gross SOB, gasping or wheezing  CV: no obvious cyanosis  MS: moves all visible extremities without noticeable abnormality  PSYCH/NEURO: pleasant and cooperative, no obvious depression or anxiety, speech and thought processing grossly intact  ASSESSMENT AND PLAN: Encounter for screening mammogram for malignant neoplasm of breast      I discussed the assessment and treatment plan with the patient. The patient was provided an opportunity to ask questions and all were answered.  The patient agreed with the plan and demonstrated an understanding of the instructions.   The patient was advised to call back or seek an in-person evaluation if the symptoms worsen or if the condition fails to improve as anticipated.   I spent 30 minutes dedicated to the care of this patient on the date of this encounter to include pre-visit review of his medical history,  Face-to-face time with the patient , and post visit ordering of testing and therapeutics.    Sherlene Shams, MD

## 2023-05-07 NOTE — Assessment & Plan Note (Addendum)
Erosive esophagitis was idenitfied on Sept 2024 EGD.  No BE< continue PPI

## 2023-05-07 NOTE — Assessment & Plan Note (Signed)
Well controlled on current regimen. Renal function stable, no changes today. Continue amlodipne 2.5 mg daily

## 2023-05-07 NOTE — Patient Instructions (Addendum)
omeprazole can be continued  indefinitely.  Just ontinue supple,eting b12 and D3    Take 1000 to 25000 mcg b12 daily as an oral supplement.  D3 has been refilled (weekly megadose)  Mammogram ordered   Goal of  BP  management is to keep it 130/80 or less.   send me readings periodocially

## 2023-05-08 ENCOUNTER — Other Ambulatory Visit: Payer: Medicare Other

## 2023-05-08 NOTE — Assessment & Plan Note (Signed)
IF AB negative. . Discussed ongoing oral supplmentation given chronic PPI use

## 2023-06-03 DIAGNOSIS — M9901 Segmental and somatic dysfunction of cervical region: Secondary | ICD-10-CM | POA: Diagnosis not present

## 2023-06-03 DIAGNOSIS — M62830 Muscle spasm of back: Secondary | ICD-10-CM | POA: Diagnosis not present

## 2023-06-03 DIAGNOSIS — M9903 Segmental and somatic dysfunction of lumbar region: Secondary | ICD-10-CM | POA: Diagnosis not present

## 2023-06-03 DIAGNOSIS — M5416 Radiculopathy, lumbar region: Secondary | ICD-10-CM | POA: Diagnosis not present

## 2023-06-20 DIAGNOSIS — Z08 Encounter for follow-up examination after completed treatment for malignant neoplasm: Secondary | ICD-10-CM | POA: Diagnosis not present

## 2023-06-20 DIAGNOSIS — Z808 Family history of malignant neoplasm of other organs or systems: Secondary | ICD-10-CM | POA: Diagnosis not present

## 2023-06-20 DIAGNOSIS — Z85828 Personal history of other malignant neoplasm of skin: Secondary | ICD-10-CM | POA: Diagnosis not present

## 2023-06-20 DIAGNOSIS — L603 Nail dystrophy: Secondary | ICD-10-CM | POA: Diagnosis not present

## 2023-06-20 DIAGNOSIS — L812 Freckles: Secondary | ICD-10-CM | POA: Diagnosis not present

## 2023-06-20 DIAGNOSIS — L821 Other seborrheic keratosis: Secondary | ICD-10-CM | POA: Diagnosis not present

## 2023-06-20 DIAGNOSIS — L308 Other specified dermatitis: Secondary | ICD-10-CM | POA: Diagnosis not present

## 2023-06-20 DIAGNOSIS — Z1283 Encounter for screening for malignant neoplasm of skin: Secondary | ICD-10-CM | POA: Diagnosis not present

## 2023-06-20 DIAGNOSIS — L570 Actinic keratosis: Secondary | ICD-10-CM | POA: Diagnosis not present

## 2023-06-20 DIAGNOSIS — D225 Melanocytic nevi of trunk: Secondary | ICD-10-CM | POA: Diagnosis not present

## 2023-06-20 DIAGNOSIS — L820 Inflamed seborrheic keratosis: Secondary | ICD-10-CM | POA: Diagnosis not present

## 2023-06-20 DIAGNOSIS — L578 Other skin changes due to chronic exposure to nonionizing radiation: Secondary | ICD-10-CM | POA: Diagnosis not present

## 2023-06-24 ENCOUNTER — Ambulatory Visit (INDEPENDENT_AMBULATORY_CARE_PROVIDER_SITE_OTHER): Payer: Medicare Other

## 2023-06-24 VITALS — BP 124/60 | HR 91 | Ht 66.0 in | Wt 171.0 lb

## 2023-06-24 DIAGNOSIS — Z0000 Encounter for general adult medical examination without abnormal findings: Secondary | ICD-10-CM | POA: Diagnosis not present

## 2023-06-24 NOTE — Patient Instructions (Signed)
 Ms. Hazell , Thank you for taking time to come for your Medicare Wellness Visit. I appreciate your ongoing commitment to your health goals. Please review the following plan we discussed and let me know if I can assist you in the future.   Referrals/Orders/Follow-Ups/Clinician Recommendations: Remember to update your Shingles and Tetanus vaccines. Keep up the great Work on your health.   This is a list of the screening recommended for you and due dates:  Health Maintenance  Topic Date Due   DTaP/Tdap/Td vaccine (2 - Td or Tdap) 10/10/2021   Zoster (Shingles) Vaccine (2 of 2) 03/05/2022   COVID-19 Vaccine (6 - 2024-25 season) 07/09/2024*   Mammogram  10/02/2023   Flu Shot  10/17/2023   Cologuard (Stool DNA test)  11/17/2023   Medicare Annual Wellness Visit  06/23/2024   Pneumonia Vaccine  Completed   DEXA scan (bone density measurement)  Completed   Hepatitis C Screening  Completed   HPV Vaccine  Aged Out  *Topic was postponed. The date shown is not the original due date.    Advanced directives: (Declined) Advance directive discussed with you today. Even though you declined this today, please call our office should you change your mind, and we can give you the proper paperwork for you to fill out.  Next Medicare Annual Wellness Visit scheduled for next year: Yes

## 2023-06-24 NOTE — Progress Notes (Signed)
 Subjective:   Christina Hammond is a 73 y.o. who presents for a Medicare Wellness preventive visit.  Visit Complete: Virtual I connected with  [name] on 06/24/23 by a audio enabled telemedicine application and verified that I am speaking with the correct person using two identifiers.  Patient Location: Home  Provider Location: Home Office  I discussed the limitations of evaluation and management by telemedicine. The patient expressed understanding and agreed to proceed.  Vital Signs: Because this visit was a virtual/telehealth visit, some criteria may be missing or patient reported. Any vitals not documented were not able to be obtained and vitals that have been documented are patient reported.  VideoDeclined- This patient declined Librarian, academic. Therefore the visit was completed with audio only.  Persons Participating in Visit: Patient.  AWV Questionnaire: Yes: Patient Medicare AWV questionnaire was completed by the patient on 06/20/23; I have confirmed that all information answered by patient is correct and no changes since this date.  Cardiac Risk Factors include: advanced age (>38men, >55 women);dyslipidemia;hypertension     Objective:    Today's Vitals   06/24/23 1334  BP: 124/60  Pulse: 91  Weight: 171 lb (77.6 kg)  Height: 5\' 6"  (1.676 m)   Body mass index is 27.6 kg/m.     06/24/2023    1:41 PM 11/20/2022    7:22 AM 07/04/2022    4:47 AM 06/13/2022    2:08 PM 04/08/2022    1:04 PM 09/29/2020    2:58 PM 09/22/2019    9:56 AM  Advanced Directives  Does Patient Have a Medical Advance Directive? No Yes No Yes No Yes Yes  Type of Furniture conservator/restorer;Living will  Healthcare Power of [place];Living will  Healthcare Power of [place];Living will Living will;Healthcare Power of Attorney  Does patient want to make changes to medical advance directive?    No - Patient declined  No - Patient declined  No - Patient declined  Copy of Healthcare Power of Attorney in Chart?  No - copy requested  No - copy requested  No - copy requested No - copy requested  Would patient like information on creating a medical advance directive?     No - Patient declined      Current Medications (verified) Outpatient Encounter Medications as of 06/24/2023  Medication Sig   acetaminophen (TYLENOL) 325 MG tablet Take 325 mg by mouth at bedtime.   amLODipine (NORVASC) 2.5 MG tablet Take 1 tablet (2.5 mg total) by mouth daily.   cetirizine (ZYRTEC) 5 MG chewable tablet Chew 5 mg by mouth daily.   diphenhydrAMINE (BENADRYL) 25 mg capsule Take 25 mg by mouth at bedtime as needed for sleep, allergies or itching.   ergocalciferol (DRISDOL) 1.25 MG (50000 UT) capsule Take 1 capsule (50,000 Units total) by mouth once a week.   fluticasone (FLONASE) 50 MCG/ACT nasal spray Place 2 sprays into both nostrils daily.   omeprazole (PRILOSEC) 40 MG capsule Take 1 capsule (40 mg total) by mouth daily.   No facility-administered encounter medications on file as of 06/24/2023.    Allergies (verified) Fluconazole, Lunesta [eszopiclone], Diclofenac, and Tape   History: Past Medical History:  Diagnosis Date   Cancer (HCC)    basal cell,  [facility]    Restless legs syndrome (RLS)    managed with minimal meds   Past Surgical History:  Procedure Laterality Date   ABDOMINAL HYSTERECTOMY  03/18/1977   APPENDECTOMY  03/18/1954  BIOPSY  11/20/2022   Procedure: BIOPSY;  Surgeon: Toney Reil, MD;  Location: [facility] ENDOSCOPY;  Service: Gastroenterology;;   CESAREAN SECTION     CHOLECYSTECTOMY     COLONOSCOPY     x2   ESOPHAGOGASTRODUODENOSCOPY (EGD) WITH PROPOFOL N/A 11/20/2022   Procedure: ESOPHAGOGASTRODUODENOSCOPY (EGD) WITH PROPOFOL;  Surgeon: Toney Reil, MD;  Location: [facility] ENDOSCOPY;  Service: Gastroenterology;  Laterality: N/A;   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   Family History   Problem Relation Age of Onset   Diabetes Mother    Stroke Mother    Cancer Father    Heart disease Father    Cancer Maternal Aunt    Mental retardation Maternal Aunt    Alcohol abuse Maternal Uncle    Breast cancer Cousin    Social History   Socioeconomic History   Marital status: Married    Spouse name: Not on file   Number of children: Not on file   Years of education: Not on file   Highest education level: Bachelor's degree (e.g., BA, AB, BS)  Occupational History   Occupation: real estate agent  Tobacco Use   Smoking status: Never   Smokeless tobacco: Never  Vaping Use   Vaping status: Never Used  Substance and Sexual Activity   Alcohol use: Yes    Alcohol/week: 1.0 - 2.0 standard drink of alcohol    Types: 1 - 2 Glasses of wine per week    Comment: weekends, social .1 glass wine 6pm   Drug use: No   Sexual activity: Not on file  Other Topics Concern   Not on file  Social History Narrative   ** Merged History Encounter **    Live with husband   Social Drivers of Corporate investment banker Strain: Low Risk  (06/24/2023)   Overall Financial Resource Strain (CARDIA)    Difficulty of Paying Living Expenses: Not hard at all  Food Insecurity: No Food Insecurity (06/24/2023)   Hunger Vital Sign    Worried About Running Out of Food in the Last Year: Never true    Ran Out of Food in the Last Year: Never true  Transportation Needs: No Transportation Needs (06/24/2023)   PRAPARE - Administrator, Civil Service (Medical): No    Lack of Transportation (Non-Medical): No  Physical Activity: Sufficiently Active (06/24/2023)   Exercise Vital Sign    Days of Exercise per Week: 5 days    Minutes of Exercise per Session: 30 min  Recent Concern: Physical Activity - Insufficiently Active (05/06/2023)   Exercise Vital Sign    Days of Exercise per Week: 2 days    Minutes of Exercise per Session: 40 min  Stress: No Stress Concern Present (06/24/2023)   [employer] of  Occupational Health - Occupational Stress Questionnaire    Feeling of Stress : Not at all  Social Connections: Unknown (05/06/2023)   Social Connection and Isolation Panel [NHANES]    Frequency of Communication with Friends and Family: More than three times a week    Frequency of Social Gatherings with Friends and Family: Once a week    Attends Religious Services: Patient declined    Database administrator or Organizations: Yes    Attends Banker Meetings: 1 to 4 times per year    Marital Status: Married    Tobacco Counseling Counseling given: Yes    Clinical Intake:  Pre-visit preparation completed: Yes  Pain :  No/denies pain     BMI - recorded: 27.6 Nutritional Status: BMI 25 -29 Overweight Nutritional Risks: None Diabetes: No  Lab Results  Component Value Date   HGBA1C 6.2 02/10/2023   HGBA1C 6.2 02/12/2022     How often do you need to have someone help you when you read instructions, pamphlets, or other written materials from your doctor or pharmacy?: 1 - Never  Interpreter Needed?: No  Information entered by :: Alia t/cma   Activities of Daily Living     06/20/2023    8:52 AM  In your present state of health, do you have any difficulty performing the following activities:  Hearing? 0  Vision? 0  Difficulty concentrating or making decisions? 0  Walking or climbing stairs? 0  Dressing or bathing? 0  Doing errands, shopping? 0  Preparing Food and eating ? N  Using the Toilet? N  In the past six months, have you accidently leaked urine? N  Do you have problems with loss of bowel control? N  Managing your Medications? N  Managing your Finances? N  Housekeeping or managing your Housekeeping? N    Patient Care Team: Sherlene Shams, MD as PCP - General (Internal Medicine)  Indicate any recent Medical Services you may have received from other than [facility] providers in the past year (date may be approximate).     Assessment:   This is a routine  wellness examination for Aqsa.  Hearing/Vision screen Hearing Screening - Comments:: Pt denies hearing def  Vision Screening - Comments:: Pt denies vision def Pt goes [facility]   Goals Addressed             This Visit's Progress    Maintain Healthy Lifestyle   On track    Stay active Stay hydrated Healthy diet       Depression Screen     06/24/2023    1:38 PM 05/07/2023    2:42 PM 02/04/2023    4:37 PM 06/13/2022    2:07 PM 04/04/2022    3:02 PM 02/21/2022    9:25 AM 01/22/2022    4:44 PM  PHQ 2/9 Scores  PHQ - 2 Score 0 0 0 0 0 0 0  PHQ- 9 Score 0          Fall Risk     06/20/2023    8:52 AM 05/07/2023    2:42 PM 02/04/2023    4:37 PM 06/13/2022    2:07 PM 06/12/2022    5:12 PM  Fall Risk   Falls in the past year? 0 0 0 0 0  Number falls in past yr: 0 0 0 0 0  Injury with Fall? 0 0 0 0   Risk for fall due to :  No Fall Risks No Fall Risks No Fall Risks   Follow up  Falls evaluation completed Falls evaluation completed      MEDICARE RISK AT HOME:  Medicare Risk at Home Any stairs in or around the home?: (Patient-Rptd) Yes If so, are there any without handrails?: (Patient-Rptd) Yes Home free of loose throw rugs in walkways, pet beds, electrical cords, etc?: (Patient-Rptd) Yes Adequate lighting in your home to reduce risk of falls?: (Patient-Rptd) Yes Life alert?: (Patient-Rptd) No Use of a cane, walker or w/c?: (Patient-Rptd) No Grab bars in the bathroom?: (Patient-Rptd) No Shower chair or bench in shower?: (Patient-Rptd) No Elevated toilet seat or a handicapped toilet?: (Patient-Rptd) Yes  TIMED UP AND GO:  Was the test performed?  No  Cognitive Function: 6CIT completed    09/22/2019    9:57 AM 09/16/2017   10:16 AM  MMSE - Mini Mental State Exam  Not completed: Unable to complete   Orientation to time  5  Orientation to Place  5  Registration  3  Attention/ Calculation  5  Recall  3  Language- name 2 objects  2  Language- repeat  1   Language- follow 3 step command  3  Language- read & follow direction  1  Write a sentence  1  Copy design  1  Total score  30        06/24/2023    1:44 PM 06/13/2022    2:09 PM 09/21/2018    9:49 AM  6CIT Screen  What Year? 0 points 0 points 0 points  What month? 0 points 0 points 0 points  What time? 0 points 0 points 0 points  Count back from 20 0 points 0 points 0 points  Months in reverse 0 points 0 points 0 points  Repeat phrase 0 points 2 points 0 points  Total Score 0 points 2 points 0 points    Immunizations Immunization History  Administered Date(s) Administered   Fluad Quad(high Dose 65+) 11/10/2018   Influenza Split 01/10/2012, 12/16/2013   Influenza, High Dose Seasonal PF 01/10/2017, 12/26/2020, 01/08/2022   Influenza, Seasonal, Injecte, Preservative Fre 11/25/2014   Influenza,inj,Quad PF,6+ Mos 12/10/2012   Influenza-Unspecified 01/18/2016, 01/12/2018, 11/21/2022   PFIZER Comirnaty(Gray Top)Covid-19 Tri-Sucrose Vaccine 01/08/2022   PFIZER(Purple Top)SARS-COV-2 Vaccination 04/06/2019, 04/26/2019, 11/19/2019, 07/13/2020   Pneumococcal Conjugate-13 07/07/2013   Pneumococcal Polysaccharide-23 01/10/2017   Tdap 10/11/2011   Zoster Recombinant(Shingrix) 01/08/2022   Zoster, Live 01/26/2014    Screening Tests Health Maintenance  Topic Date Due   DTaP/Tdap/Td (2 - Td or Tdap) 10/10/2021   Zoster Vaccines- Shingrix (2 of 2) 03/05/2022   COVID-19 Vaccine (6 - 2024-25 season) 07/09/2024 (Originally 11/17/2022)   MAMMOGRAM  10/02/2023   INFLUENZA VACCINE  10/17/2023   Fecal DNA (Cologuard)  11/17/2023   Medicare Annual Wellness (AWV)  06/23/2024   Pneumonia Vaccine 77+ Years old  Completed   DEXA SCAN  Completed   Hepatitis C Screening  Completed   HPV VACCINES  Aged Out    Health Maintenance  Health Maintenance Due  Topic Date Due   DTaP/Tdap/Td (2 - Td or Tdap) 10/10/2021   Zoster Vaccines- Shingrix (2 of 2) 03/05/2022   Health Maintenance Items  Addressed: See Nurse Notes  Additional Screening:  Vision Screening: Recommended annual ophthalmology exams for early detection of glaucoma and other disorders of the eye.  Dental Screening: Recommended annual dental exams for proper oral hygiene  Community Resource Referral / Chronic Care Management: CRR required this visit?  No   CCM required this visit?  No     Plan:     I have personally reviewed and noted the following in the patient's chart:   Medical and social history Use of alcohol, tobacco or illicit drugs  Current medications and supplements including opioid prescriptions. Patient is not currently taking opioid prescriptions. Functional ability and status Nutritional status Physical activity Advanced directives List of other physicians Hospitalizations, surgeries, and ER visits in previous 12 months Vitals Screenings to include cognitive, depression, and falls Referrals and appointments  In addition, I have reviewed and discussed with patient certain preventive protocols, quality metrics, and best practice recommendations. A written personalized care plan for preventive services as well as general preventive health recommendations were  provided to patient.     Arta Silence, CMA   06/24/2023   After Visit Summary: (MyChart) Due to this being a telephonic visit, the after visit summary with patients personalized plan was offered to patient via MyChart   Notes: Pt is aware and agreed; need to update vaccine for Shingles and Tetanus

## 2023-07-01 DIAGNOSIS — M9903 Segmental and somatic dysfunction of lumbar region: Secondary | ICD-10-CM | POA: Diagnosis not present

## 2023-07-01 DIAGNOSIS — M5416 Radiculopathy, lumbar region: Secondary | ICD-10-CM | POA: Diagnosis not present

## 2023-07-01 DIAGNOSIS — M9901 Segmental and somatic dysfunction of cervical region: Secondary | ICD-10-CM | POA: Diagnosis not present

## 2023-07-01 DIAGNOSIS — M62830 Muscle spasm of back: Secondary | ICD-10-CM | POA: Diagnosis not present

## 2023-07-14 ENCOUNTER — Encounter: Payer: Self-pay | Admitting: Internal Medicine

## 2023-07-14 DIAGNOSIS — I10 Essential (primary) hypertension: Secondary | ICD-10-CM

## 2023-07-15 ENCOUNTER — Encounter: Payer: Self-pay | Admitting: Internal Medicine

## 2023-07-15 MED ORDER — AMLODIPINE BESYLATE 5 MG PO TABS: 5.0000 mg | ORAL_TABLET | Freq: Every day | ORAL | 2 refills | Status: DC

## 2023-07-15 NOTE — Assessment & Plan Note (Signed)
 Home readings elevated .  Increased amlodipine  to 5 mg daily .

## 2023-07-26 ENCOUNTER — Other Ambulatory Visit: Payer: Self-pay | Admitting: Internal Medicine

## 2023-07-26 DIAGNOSIS — E559 Vitamin D deficiency, unspecified: Secondary | ICD-10-CM

## 2023-07-29 DIAGNOSIS — M9903 Segmental and somatic dysfunction of lumbar region: Secondary | ICD-10-CM | POA: Diagnosis not present

## 2023-07-29 DIAGNOSIS — M9901 Segmental and somatic dysfunction of cervical region: Secondary | ICD-10-CM | POA: Diagnosis not present

## 2023-07-29 DIAGNOSIS — M5416 Radiculopathy, lumbar region: Secondary | ICD-10-CM | POA: Diagnosis not present

## 2023-07-29 DIAGNOSIS — M62830 Muscle spasm of back: Secondary | ICD-10-CM | POA: Diagnosis not present

## 2023-08-06 DIAGNOSIS — H25813 Combined forms of age-related cataract, bilateral: Secondary | ICD-10-CM | POA: Diagnosis not present

## 2023-08-27 DIAGNOSIS — M9903 Segmental and somatic dysfunction of lumbar region: Secondary | ICD-10-CM | POA: Diagnosis not present

## 2023-08-27 DIAGNOSIS — M62830 Muscle spasm of back: Secondary | ICD-10-CM | POA: Diagnosis not present

## 2023-08-27 DIAGNOSIS — M5416 Radiculopathy, lumbar region: Secondary | ICD-10-CM | POA: Diagnosis not present

## 2023-08-27 DIAGNOSIS — M9901 Segmental and somatic dysfunction of cervical region: Secondary | ICD-10-CM | POA: Diagnosis not present

## 2023-08-28 DIAGNOSIS — D0439 Carcinoma in situ of skin of other parts of face: Secondary | ICD-10-CM | POA: Diagnosis not present

## 2023-09-23 DIAGNOSIS — M62830 Muscle spasm of back: Secondary | ICD-10-CM | POA: Diagnosis not present

## 2023-09-23 DIAGNOSIS — M9901 Segmental and somatic dysfunction of cervical region: Secondary | ICD-10-CM | POA: Diagnosis not present

## 2023-09-23 DIAGNOSIS — M5416 Radiculopathy, lumbar region: Secondary | ICD-10-CM | POA: Diagnosis not present

## 2023-09-23 DIAGNOSIS — M9903 Segmental and somatic dysfunction of lumbar region: Secondary | ICD-10-CM | POA: Diagnosis not present

## 2023-10-03 ENCOUNTER — Ambulatory Visit
Admission: RE | Admit: 2023-10-03 | Discharge: 2023-10-03 | Disposition: A | Discharge status: Home / Self Care | Source: Ambulatory Visit | Attending: Internal Medicine | Admitting: Internal Medicine

## 2023-10-03 DIAGNOSIS — Z1231 Encounter for screening mammogram for malignant neoplasm of breast: Secondary | ICD-10-CM

## 2023-10-11 ENCOUNTER — Other Ambulatory Visit: Payer: Self-pay | Admitting: Internal Medicine

## 2023-10-21 DIAGNOSIS — M9901 Segmental and somatic dysfunction of cervical region: Secondary | ICD-10-CM | POA: Diagnosis not present

## 2023-10-21 DIAGNOSIS — M62830 Muscle spasm of back: Secondary | ICD-10-CM | POA: Diagnosis not present

## 2023-10-21 DIAGNOSIS — M5416 Radiculopathy, lumbar region: Secondary | ICD-10-CM | POA: Diagnosis not present

## 2023-10-21 DIAGNOSIS — M9903 Segmental and somatic dysfunction of lumbar region: Secondary | ICD-10-CM | POA: Diagnosis not present

## 2023-10-26 ENCOUNTER — Encounter: Payer: Self-pay | Admitting: Internal Medicine

## 2023-11-18 DIAGNOSIS — M9903 Segmental and somatic dysfunction of lumbar region: Secondary | ICD-10-CM | POA: Diagnosis not present

## 2023-11-18 DIAGNOSIS — M62830 Muscle spasm of back: Secondary | ICD-10-CM | POA: Diagnosis not present

## 2023-11-18 DIAGNOSIS — M9901 Segmental and somatic dysfunction of cervical region: Secondary | ICD-10-CM | POA: Diagnosis not present

## 2023-11-18 DIAGNOSIS — M5416 Radiculopathy, lumbar region: Secondary | ICD-10-CM | POA: Diagnosis not present

## 2023-12-09 ENCOUNTER — Ambulatory Visit (INDEPENDENT_AMBULATORY_CARE_PROVIDER_SITE_OTHER): Admitting: Internal Medicine

## 2023-12-09 VITALS — BP 130/66 | HR 78 | Ht 66.0 in | Wt 162.4 lb

## 2023-12-09 DIAGNOSIS — R911 Solitary pulmonary nodule: Secondary | ICD-10-CM | POA: Diagnosis not present

## 2023-12-09 DIAGNOSIS — E559 Vitamin D deficiency, unspecified: Secondary | ICD-10-CM | POA: Diagnosis not present

## 2023-12-09 DIAGNOSIS — N952 Postmenopausal atrophic vaginitis: Secondary | ICD-10-CM

## 2023-12-09 DIAGNOSIS — E782 Mixed hyperlipidemia: Secondary | ICD-10-CM

## 2023-12-09 DIAGNOSIS — Z1211 Encounter for screening for malignant neoplasm of colon: Secondary | ICD-10-CM | POA: Diagnosis not present

## 2023-12-09 DIAGNOSIS — I10 Essential (primary) hypertension: Secondary | ICD-10-CM | POA: Diagnosis not present

## 2023-12-09 MED ORDER — TETANUS-DIPHTH-ACELL PERTUSSIS 5-2.5-18.5 LF-MCG/0.5 IM SUSY: 0.5000 mL | PREFILLED_SYRINGE | Freq: Once | INTRAMUSCULAR | 0 refills | Status: AC

## 2023-12-09 MED ORDER — ESTRADIOL 0.1 MG/GM VA CREA: 1.0000 | TOPICAL_CREAM | Freq: Every day | VAGINAL | 12 refills | Status: DC

## 2023-12-09 NOTE — Assessment & Plan Note (Signed)
 3 mm,  continue annual screening

## 2023-12-09 NOTE — Patient Instructions (Addendum)
 I enjoyed seeing you today and am thrilled that you are doing so well    We discussed adding Estrace  (topical estrogen) to treat the unwelcome changes of menopause .   Use it nightly for 2 weeks , then reduce use to every other night or every 3 nights.     Apply a small amount to the urethra and the introitus (the opening to your vagina) as well    You are due for your tetanus-diptheria-pertussis vaccine   (TDaP)   You can get it for free at your pharmacy  It's time to repeat the Colguard test .  It has been ordered today

## 2023-12-09 NOTE — Progress Notes (Unsigned)
 Subjective:  Patient ID: Christina Hammond Right, female    DOB: 11/30/1950  Age: 73 y.o. MRN: [id number]  CC: There were no encounter diagnoses.   HPI Christina Hammond Right presents for  Chief Complaint  Patient presents with   Medical Management of Chronic Issues   1) weight loss of 9 lbs since Jan  2) h/o epistaxis that bled profusely required ER visit and Juengel   3) wanting a female version  of Viagra .     Outpatient Medications Prior to Visit  Medication Sig Dispense Refill   acetaminophen (TYLENOL) 325 MG tablet Take 325 mg by mouth at bedtime.     amLODipine  (NORVASC ) 5 MG tablet TAKE 1 TABLET BY MOUTH DAILY 90 tablet 1   cetirizine (ZYRTEC) 5 MG chewable tablet Chew 5 mg by mouth daily.     diphenhydrAMINE (BENADRYL) 25 mg capsule Take 25 mg by mouth at bedtime as needed for sleep, allergies or itching.     fluticasone  (FLONASE ) 50 MCG/ACT nasal spray Place 2 sprays into both nostrils daily.     omeprazole  (PRILOSEC) 40 MG capsule Take 1 capsule (40 mg total) by mouth daily. 90 capsule 2   Vitamin D , Ergocalciferol , (DRISDOL ) 1.25 MG (50000 UNIT) CAPS capsule TAKE 1 CAPSULE BY MOUTH ONCE WEEKLY 12 capsule 0   No facility-administered medications prior to visit.    Review of Systems;  Patient denies headache, fevers, malaise, unintentional weight loss, skin rash, eye pain, sinus congestion and sinus pain, sore throat, dysphagia,  hemoptysis , cough, dyspnea, wheezing, chest pain, palpitations, orthopnea, edema, abdominal pain, nausea, melena, diarrhea, constipation, flank pain, dysuria, hematuria, urinary  Frequency, nocturia, numbness, tingling, seizures,  Focal weakness, Loss of consciousness,  Tremor, insomnia, depression, anxiety, and suicidal ideation.      Objective:  There were no vitals taken for this visit.  BP Readings from Last 3 Encounters:  06/24/23 124/60  05/07/23 104/79  03/27/23 124/60    Wt Readings from Last 3 Encounters:  06/24/23 171 lb  (77.6 kg)  05/07/23 161 lb (73 kg)  03/27/23 171 lb 6.4 oz (77.7 kg)    Physical Exam  Lab Results  Component Value Date   HGBA1C 6.2 02/10/2023   HGBA1C 6.2 02/12/2022    Lab Results  Component Value Date   CREATININE 0.74 02/10/2023   CREATININE 0.74 07/04/2022   CREATININE 0.66 02/12/2022    Lab Results  Component Value Date   WBC 2.8 (L) 02/10/2023   HGB 14.0 02/10/2023   HCT 42.9 02/10/2023   PLT 278.0 02/10/2023   GLUCOSE 95 02/10/2023   CHOL 243 (H) 02/10/2023   TRIG 148.0 02/10/2023   HDL 51.40 02/10/2023   LDLDIRECT 156.0 02/10/2023   LDLCALC 162 (H) 02/10/2023   ALT 13 02/10/2023   AST 14 02/10/2023   NA 140 02/10/2023   K 4.1 02/10/2023   CL 102 02/10/2023   CREATININE 0.74 02/10/2023   BUN 15 02/10/2023   CO2 31 02/10/2023   TSH 1.58 02/10/2023   INR 1.0 04/17/2022   HGBA1C 6.2 02/10/2023    MM 3D SCREENING MAMMOGRAM BILATERAL BREAST Result Date: 10/07/2023 CLINICAL DATA:  Screening. EXAM: DIGITAL SCREENING BILATERAL MAMMOGRAM WITH TOMOSYNTHESIS AND CAD TECHNIQUE: Bilateral screening digital craniocaudal and mediolateral oblique mammograms were obtained. Bilateral screening digital breast tomosynthesis was performed. The images were evaluated with computer-aided detection. COMPARISON:  Previous exam(s). ACR Breast Density Category b: There are scattered areas of fibroglandular density. FINDINGS: There are no findings suspicious for malignancy.  IMPRESSION: No mammographic evidence of malignancy. A result letter of this screening mammogram will be mailed directly to the patient. RECOMMENDATION: Screening mammogram in one year. (Code:SM-B-01Y) BI-RADS CATEGORY  1: Negative. Electronically Signed   By: Debby Satterfield M.D.   On: 10/07/2023 14:36    Assessment & Plan:  .There are no diagnoses linked to this encounter.   I spent 34 minutes on the day of this face to face encounter reviewing patient's  most recent visit with cardiology,  nephrology,  and  neurology,  prior relevant surgical and non surgical procedures, recent  labs and imaging studies, counseling on weight management,  reviewing the assessment and plan with patient, and post visit ordering and reviewing of  diagnostics and therapeutics with patient  .   Follow-up: No follow-ups on file.   Verneita LITTIE Kettering, MD

## 2023-12-10 NOTE — Assessment & Plan Note (Signed)
 Trial of vaginal estrogen offered and accepted

## 2023-12-10 NOTE — Assessment & Plan Note (Addendum)
 Home readings are at goal on  amlodipine  to 5 mg daily .

## 2023-12-12 DIAGNOSIS — Z23 Encounter for immunization: Secondary | ICD-10-CM | POA: Diagnosis not present

## 2023-12-16 DIAGNOSIS — M9901 Segmental and somatic dysfunction of cervical region: Secondary | ICD-10-CM | POA: Diagnosis not present

## 2023-12-16 DIAGNOSIS — M9903 Segmental and somatic dysfunction of lumbar region: Secondary | ICD-10-CM | POA: Diagnosis not present

## 2023-12-16 DIAGNOSIS — M62830 Muscle spasm of back: Secondary | ICD-10-CM | POA: Diagnosis not present

## 2023-12-16 DIAGNOSIS — M5416 Radiculopathy, lumbar region: Secondary | ICD-10-CM | POA: Diagnosis not present

## 2023-12-24 DIAGNOSIS — Z1211 Encounter for screening for malignant neoplasm of colon: Secondary | ICD-10-CM | POA: Diagnosis not present

## 2023-12-26 DIAGNOSIS — L218 Other seborrheic dermatitis: Secondary | ICD-10-CM | POA: Diagnosis not present

## 2023-12-26 DIAGNOSIS — L308 Other specified dermatitis: Secondary | ICD-10-CM | POA: Diagnosis not present

## 2023-12-26 DIAGNOSIS — L821 Other seborrheic keratosis: Secondary | ICD-10-CM | POA: Diagnosis not present

## 2023-12-26 DIAGNOSIS — L812 Freckles: Secondary | ICD-10-CM | POA: Diagnosis not present

## 2023-12-26 DIAGNOSIS — Z808 Family history of malignant neoplasm of other organs or systems: Secondary | ICD-10-CM | POA: Diagnosis not present

## 2023-12-26 DIAGNOSIS — D225 Melanocytic nevi of trunk: Secondary | ICD-10-CM | POA: Diagnosis not present

## 2023-12-26 DIAGNOSIS — Z85828 Personal history of other malignant neoplasm of skin: Secondary | ICD-10-CM | POA: Diagnosis not present

## 2023-12-26 DIAGNOSIS — R238 Other skin changes: Secondary | ICD-10-CM | POA: Diagnosis not present

## 2023-12-26 DIAGNOSIS — Z1283 Encounter for screening for malignant neoplasm of skin: Secondary | ICD-10-CM | POA: Diagnosis not present

## 2023-12-26 DIAGNOSIS — L570 Actinic keratosis: Secondary | ICD-10-CM | POA: Diagnosis not present

## 2023-12-26 DIAGNOSIS — B078 Other viral warts: Secondary | ICD-10-CM | POA: Diagnosis not present

## 2023-12-31 LAB — COLOGUARD: COLOGUARD: NEGATIVE

## 2024-01-02 ENCOUNTER — Ambulatory Visit: Payer: Self-pay | Admitting: Internal Medicine

## 2024-01-07 ENCOUNTER — Encounter: Payer: Self-pay | Admitting: Internal Medicine

## 2024-01-07 DIAGNOSIS — D0439 Carcinoma in situ of skin of other parts of face: Secondary | ICD-10-CM | POA: Diagnosis not present

## 2024-01-07 MED ORDER — ESTRADIOL 0.01 % VA CREA: 1.0000 g | TOPICAL_CREAM | VAGINAL | 12 refills | Status: DC

## 2024-01-12 DIAGNOSIS — M62830 Muscle spasm of back: Secondary | ICD-10-CM | POA: Diagnosis not present

## 2024-01-12 DIAGNOSIS — M9903 Segmental and somatic dysfunction of lumbar region: Secondary | ICD-10-CM | POA: Diagnosis not present

## 2024-01-12 DIAGNOSIS — M9901 Segmental and somatic dysfunction of cervical region: Secondary | ICD-10-CM | POA: Diagnosis not present

## 2024-01-12 DIAGNOSIS — M5416 Radiculopathy, lumbar region: Secondary | ICD-10-CM | POA: Diagnosis not present

## 2024-01-14 DIAGNOSIS — H6123 Impacted cerumen, bilateral: Secondary | ICD-10-CM | POA: Diagnosis not present

## 2024-01-14 DIAGNOSIS — H900 Conductive hearing loss, bilateral: Secondary | ICD-10-CM | POA: Diagnosis not present

## 2024-02-04 ENCOUNTER — Other Ambulatory Visit: Payer: Self-pay | Admitting: Internal Medicine

## 2024-02-10 DIAGNOSIS — M9903 Segmental and somatic dysfunction of lumbar region: Secondary | ICD-10-CM | POA: Diagnosis not present

## 2024-02-10 DIAGNOSIS — M62830 Muscle spasm of back: Secondary | ICD-10-CM | POA: Diagnosis not present

## 2024-02-10 DIAGNOSIS — M5416 Radiculopathy, lumbar region: Secondary | ICD-10-CM | POA: Diagnosis not present

## 2024-02-10 DIAGNOSIS — M9901 Segmental and somatic dysfunction of cervical region: Secondary | ICD-10-CM | POA: Diagnosis not present

## 2024-02-19 ENCOUNTER — Ambulatory Visit: Admission: RE | Admit: 2024-02-19 | Payer: Medicare Other | Source: Ambulatory Visit

## 2024-03-01 ENCOUNTER — Other Ambulatory Visit: Payer: Self-pay | Admitting: Internal Medicine

## 2024-03-01 DIAGNOSIS — E559 Vitamin D deficiency, unspecified: Secondary | ICD-10-CM

## 2024-03-02 DIAGNOSIS — M9901 Segmental and somatic dysfunction of cervical region: Secondary | ICD-10-CM | POA: Diagnosis not present

## 2024-03-02 DIAGNOSIS — M9903 Segmental and somatic dysfunction of lumbar region: Secondary | ICD-10-CM | POA: Diagnosis not present

## 2024-03-02 DIAGNOSIS — M62830 Muscle spasm of back: Secondary | ICD-10-CM | POA: Diagnosis not present

## 2024-03-02 DIAGNOSIS — M5416 Radiculopathy, lumbar region: Secondary | ICD-10-CM | POA: Diagnosis not present

## 2024-03-03 DIAGNOSIS — M62830 Muscle spasm of back: Secondary | ICD-10-CM | POA: Diagnosis not present

## 2024-03-03 DIAGNOSIS — M9903 Segmental and somatic dysfunction of lumbar region: Secondary | ICD-10-CM | POA: Diagnosis not present

## 2024-03-03 DIAGNOSIS — M9901 Segmental and somatic dysfunction of cervical region: Secondary | ICD-10-CM | POA: Diagnosis not present

## 2024-03-03 DIAGNOSIS — M5416 Radiculopathy, lumbar region: Secondary | ICD-10-CM | POA: Diagnosis not present

## 2024-03-04 MED ORDER — ESTRADIOL 0.01 % VA CREA: 1.0000 g | TOPICAL_CREAM | VAGINAL | 3 refills | Status: AC

## 2024-06-28 ENCOUNTER — Encounter
